# Patient Record
Sex: Female | Born: 1970
Health system: Southern US, Community
[De-identification: ages and names within clinical notes are randomized; demographics above are authoritative.]

## PROBLEM LIST (undated history)

## (undated) DIAGNOSIS — T7840XA Allergy, unspecified, initial encounter: Secondary | ICD-10-CM

## (undated) DIAGNOSIS — I10 Essential (primary) hypertension: Secondary | ICD-10-CM

## (undated) DIAGNOSIS — Z8249 Family history of ischemic heart disease and other diseases of the circulatory system: Secondary | ICD-10-CM

## (undated) HISTORY — DX: Essential (primary) hypertension: I10

## (undated) HISTORY — DX: Allergy, unspecified, initial encounter: T78.40XA

## (undated) HISTORY — PX: DILATION AND CURETTAGE OF UTERUS: SHX78

## (undated) HISTORY — DX: Family history of ischemic heart disease and other diseases of the circulatory system: Z82.49

---

## 2017-07-24 ENCOUNTER — Other Ambulatory Visit (HOSPITAL_COMMUNITY)
Admission: RE | Admit: 2017-07-24 | Discharge: 2017-07-24 | Disposition: A | Discharge status: Home / Self Care | Payer: BLUE CROSS/BLUE SHIELD | Source: Ambulatory Visit | Attending: Obstetrics and Gynecology | Admitting: Obstetrics and Gynecology

## 2017-07-24 ENCOUNTER — Encounter: Payer: Self-pay | Admitting: Obstetrics and Gynecology

## 2017-07-24 ENCOUNTER — Ambulatory Visit (INDEPENDENT_AMBULATORY_CARE_PROVIDER_SITE_OTHER): Payer: BLUE CROSS/BLUE SHIELD | Admitting: Obstetrics and Gynecology

## 2017-07-24 ENCOUNTER — Other Ambulatory Visit: Payer: Self-pay

## 2017-07-24 VITALS — BP 122/70 | HR 60 | Resp 14 | Ht 64.0 in | Wt 150.0 lb

## 2017-07-24 DIAGNOSIS — Z1211 Encounter for screening for malignant neoplasm of colon: Secondary | ICD-10-CM | POA: Diagnosis not present

## 2017-07-24 DIAGNOSIS — R61 Generalized hyperhidrosis: Secondary | ICD-10-CM | POA: Diagnosis not present

## 2017-07-24 DIAGNOSIS — Z01419 Encounter for gynecological examination (general) (routine) without abnormal findings: Secondary | ICD-10-CM | POA: Diagnosis not present

## 2017-07-24 DIAGNOSIS — Z0000 Encounter for general adult medical examination without abnormal findings: Secondary | ICD-10-CM | POA: Diagnosis not present

## 2017-07-24 DIAGNOSIS — Z124 Encounter for screening for malignant neoplasm of cervix: Secondary | ICD-10-CM | POA: Insufficient documentation

## 2017-07-24 DIAGNOSIS — R5383 Other fatigue: Secondary | ICD-10-CM | POA: Diagnosis not present

## 2017-07-24 DIAGNOSIS — N898 Other specified noninflammatory disorders of vagina: Secondary | ICD-10-CM

## 2017-07-24 DIAGNOSIS — Z30431 Encounter for routine checking of intrauterine contraceptive device: Secondary | ICD-10-CM | POA: Diagnosis not present

## 2017-07-24 NOTE — Progress Notes (Signed)
47 y.o. [id number] MarriedCaucasianF here for annual exam.  Mirena IUD was placed on 07/24/16 (replaced her prior IUD). She doesn't have regular cycles, just irregular spotting.  Period Duration (Days): spotting 2-3 days  Period Pattern: (!) Irregular Menstrual Flow: Light Menstrual Control: Panty liner Dysmenorrhea: None  She c/o vaginal dryness with intercourse. Sometimes uses a lubricant, helps some. She has some night sweats, no hot flashes. Doesn't always wake up at night, but is sweaty when she wakes up. She is tired and stressed out.  She moved here from [place] in November. She came here for her job, it is very demanding (she is on call 24/7). Husband had a PE in January, he has some cardiac issues. Under lots of stress.  C/O fatigue, 5 lb weight gain.   Patient's last menstrual period was 07/12/2017.          Sexually active: Yes.    The current method of family planning is IUD.    Exercising: Yes.    cardio/ running/ elliptical/ rowing machine  Smoker:  no  Health Maintenance: Pap:  03/2016 WNL per patient History of abnormal Pap:  no MMG:  04-18-16 WNL  Colonoscopy:  Never BMD:   Never TDaP:  Unsure  Gardasil: N/A   reports that she has never smoked. She has never used smokeless tobacco. She reports that she drinks about 0.6 oz of alcohol per week. She reports that she does not use drugs. She works as a Solicitor. Kids are 23, 15 and 12. Husband has 4 kids, his 62 and 70 year old with them.   History reviewed. No pertinent past medical history.  Past Surgical History:  Procedure Laterality Date  . DILATION AND CURETTAGE OF UTERUS     1998 & 2001     Current Outpatient Medications  Medication Sig Dispense Refill  . cetirizine (ZYRTEC) 10 MG tablet Take 10 mg by mouth daily.    Marland Kitchen levonorgestrel (MIRENA) 20 MCG/24HR IUD 1 each by Intrauterine route once.     No current facility-administered medications for this visit.     Family History  Problem Relation Age of Onset  .  Emphysema Mother   . Coronary artery disease Father   . Coronary artery disease Maternal Grandfather   . Cervical cancer Paternal Grandmother     Review of Systems  Constitutional: Negative.   HENT: Negative.   Eyes: Negative.   Respiratory: Negative.   Cardiovascular: Negative.   Gastrointestinal: Negative.   Endocrine: Negative.   Genitourinary:       Vaginal dryness   Musculoskeletal: Negative.   Skin: Negative.   Allergic/Immunologic: Negative.   Neurological: Negative.   Psychiatric/Behavioral: Negative.     Exam:   BP 122/70 (BP Location: Right Arm, Patient Position: Sitting, Cuff Size: Normal)   Pulse 60   Resp 14   Ht 5\' 4"  (1.626 m)   Wt 150 lb (68 kg)   LMP 07/12/2017   BMI 25.75 kg/m   Weight change: @WEIGHTCHANGE @ Height:   Height: 5\' 4"  (162.6 cm)  Ht Readings from Last 3 Encounters:  07/24/17 5\' 4"  (1.626 m)    General appearance: alert, cooperative and appears stated age Head: Normocephalic, without obvious abnormality, atraumatic Neck: no adenopathy, supple, symmetrical, trachea midline and thyroid normal to inspection and palpation Lungs: clear to auscultation bilaterally Cardiovascular: regular rate and rhythm Breasts: normal appearance, no masses or tenderness Abdomen: soft, non-tender; non distended,  no masses,  no organomegaly Extremities: extremities normal, atraumatic, no cyanosis or  edema Skin: Skin color, texture, turgor normal. No rashes or lesions Lymph nodes: Cervical, supraclavicular, and axillary nodes normal. No abnormal inguinal nodes palpated Neurologic: Grossly normal   Pelvic: External genitalia:  no lesions              Urethra:  normal appearing urethra with no masses, tenderness or lesions              Bartholins and Skenes: normal                 Vagina: normal appearing vagina with normal color and discharge, no lesions. Small grade 2 rectocele, not symptomatic              Cervix: no lesions, IUD string 3 cm                Bimanual Exam:  Uterus:  normal size, contour, position, consistency, mobility, non-tender              Adnexa: no mass, fullness, tenderness               Rectovaginal: Confirms               Anus:  normal sphincter tone, no lesions  Chaperone was present for exam.  A:  Well Woman with normal exam  IUD check  Night sweats  Vaginal dryness  Rectocele, not symptomatic. Try to avoid heavy lifting and straining   P:   Pap with hpv  Screening labs, FSH, TSH  Will start vaginal estrogen  Discussed behavioral changes and herbal products for menopause  Discussed breast self exam  Discussed calcium and vit D intake

## 2017-07-24 NOTE — Patient Instructions (Addendum)
EXERCISE AND DIET:  We recommended that you start or continue a regular exercise program for good health. Regular exercise means any activity that makes your heart beat faster and makes you sweat.  We recommend exercising at least 30 minutes per day at least 3 days a week, preferably 4 or 5.  We also recommend a diet low in fat and sugar.  Inactivity, poor dietary choices and obesity can cause diabetes, heart attack, stroke, and kidney damage, among others.    ALCOHOL AND SMOKING:  Women should limit their alcohol intake to no more than 7 drinks/beers/glasses of wine (combined, not each!) per week. Moderation of alcohol intake to this level decreases your risk of breast cancer and liver damage. And of course, no recreational drugs are part of a healthy lifestyle.  And absolutely no smoking or even second hand smoke. Most people know smoking can cause heart and lung diseases, but did you know it also contributes to weakening of your bones? Aging of your skin?  Yellowing of your teeth and nails?  CALCIUM AND VITAMIN D:  Adequate intake of calcium and Vitamin D are recommended.  The recommendations for exact amounts of these supplements seem to change often, but generally speaking 600 mg of calcium (either carbonate or citrate) and 800 units of Vitamin D per day seems prudent. Certain women may benefit from higher intake of Vitamin D.  If you are among these women, your doctor will have told you during your visit.    PAP SMEARS:  Pap smears, to check for cervical cancer or precancers,  have traditionally been done yearly, although recent scientific advances have shown that most women can have pap smears less often.  However, every woman still should have a physical exam from her gynecologist every year. It will include a breast check, inspection of the vulva and vagina to check for abnormal growths or skin changes, a visual exam of the cervix, and then an exam to evaluate the size and shape of the uterus and  ovaries.  And after 48 years of age, a rectal exam is indicated to check for rectal cancers. We will also provide age appropriate advice regarding health maintenance, like when you should have certain vaccines, screening for sexually transmitted diseases, bone density testing, colonoscopy, mammograms, etc.   MAMMOGRAMS:  All women over 29 years old should have a yearly mammogram. Many facilities now offer a "3D" mammogram, which may cost around $50 extra out of pocket. If possible,  we recommend you accept the option to have the 3D mammogram performed.  It both reduces the number of women who will be called back for extra views which then turn out to be normal, and it is better than the routine mammogram at detecting truly abnormal areas.    COLONOSCOPY:  Colonoscopy to screen for colon cancer is recommended for all women at age 13.  We know, you hate the About Rectocele  Overview  A rectocele is a type of hernia which causes different degrees of bulging of the rectal tissues into the vaginal wall.  You may even notice that it presses against the vaginal wall so much that some vaginal tissues droop outside of the opening of your vagina.  Causes of Rectocele  The most common cause is childbirth.  The muscles and ligaments in the pelvis that hold up and support the female organs and vagina become stretched and weakened during labor and delivery.  The more babies you have, the more the support tissues are  stretched and weakened.  Not everyone who has a baby will develop a rectocele.  Some women have stronger supporting tissue in the pelvis and may not have as much of a problem as others.  Women who have a Cesarean section usually do not get rectocele's unless they pushed a long time prior to the cesarean delivery.  Other conditions that can cause a rectocele include chronic constipation, a chronic cough, a lot of heavy lifting, and obesity.  Older women may have this problem because the loss of female  hormones causes the vaginal tissue to become weaker.  Symptoms  There may not be any symptoms.  If you do have symptoms, they may include:  Pelvic pressure in the rectal area  Protrusion of the lower part of the vagina through the opening of the vagina  Constipation and trapping of the stool, making it difficult to have a bowel movement.  In severe cases, you may have to press on the lower part of your vagina to help push the stool out of you rectum.  This is called splinting to empty.  Diagnosing Rectocele  Your health care provider will ask about your symptoms and perform a pelvic exam.  S/he will ask you to bear down, pushing like you are having a bowel movement so as to see how far the lower part of the vagina protrudes into the vagina and possible outside of the vagina.  Your provider will also ask you to contract the muscles of your pelvis (like you are stopping the stream in the middle of urinating) to determine the strength of your pelvic muscles.  Your provider may also do a rectal exam.  Treatment Options  If you do not have any symptoms, no treatment may be necessary.  Other treatment options include:  Pelvic floor exercises: Contracting the muscles in your genital area may help strengthen your muscles and support the organs.  Be sure to get proper exercise instruction from you physical therapist.  A pessary (removealbe pelvic support device) sometimes helps rectocele symptoms.  Surgery: Surgical repair may be necessary. In some cases the uterus may need to be taken out ( a hysterectomy) as well.  There are many types of surgery for pelvic support problems.  Look for physicians who specialize in repair procedures.  You can take care of yourself by:  Treating and preventing constipation  Avoiding heavy lifting, and lifting correctly (with your legs, not with you waist or back)  Treating a chronic cough or bronchitis  Not smoking  avoiding too much weight gain  Doing  pelvic floor exercises   2007, [employer] Doc.[email or web address] of the prep.  We agree, BUT, having colon cancer and not knowing it is worse!!  Colon cancer so often starts as a polyp that can be seen and removed at colonscopy, which can quite literally save your life!  And if your first colonoscopy is normal and you have no family history of colon cancer, most women don't have to have it again for 10 years.  Once every ten years, you can do something that may end up saving your life, right?  We will be happy to help you get it scheduled when you are ready.  Be sure to check your insurance coverage so you understand how much it will cost.  It may be covered as a preventative service at no cost, but you should check your particular policy.

## 2017-07-25 LAB — COMPREHENSIVE METABOLIC PANEL
ALT: 18 IU/L (ref 0–32)
AST: 18 IU/L (ref 0–40)
Albumin/Globulin Ratio: 1.7 (ref 1.2–2.2)
Albumin: 4.3 g/dL (ref 3.5–5.5)
Alkaline Phosphatase: 53 IU/L (ref 39–117)
BILIRUBIN TOTAL: 0.3 mg/dL (ref 0.0–1.2)
BUN/Creatinine Ratio: 15 (ref 9–23)
BUN: 13 mg/dL (ref 6–24)
CALCIUM: 9.8 mg/dL (ref 8.7–10.2)
CHLORIDE: 105 mmol/L (ref 96–106)
CO2: 25 mmol/L (ref 20–29)
Creatinine, Ser: 0.87 mg/dL (ref 0.57–1.00)
GFR calc Af Amer: 92 mL/min/{1.73_m2} (ref >59)
GFR calc non Af Amer: 80 mL/min/{1.73_m2} (ref >59)
Globulin, Total: 2.6 g/dL (ref 1.5–4.5)
Glucose: 69 mg/dL (ref 65–99)
Potassium: 4.6 mmol/L (ref 3.5–5.2)
Sodium: 146 mmol/L — ABNORMAL HIGH (ref 134–144)
Total Protein: 6.9 g/dL (ref 6.0–8.5)

## 2017-07-25 LAB — FOLLICLE STIMULATING HORMONE: FSH: 10.3 m[IU]/mL

## 2017-07-25 LAB — CBC
Hematocrit: 44.8 % (ref 34.0–46.6)
Hemoglobin: 14.5 g/dL (ref 11.1–15.9)
MCH: 28.8 pg (ref 26.6–33.0)
MCHC: 32.4 g/dL (ref 31.5–35.7)
MCV: 89 fL (ref 79–97)
PLATELETS: 241 10*3/uL (ref 150–379)
RBC: 5.03 x10E6/uL (ref 3.77–5.28)
RDW: 13.4 % (ref 12.3–15.4)
WBC: 7.7 10*3/uL (ref 3.4–10.8)

## 2017-07-25 LAB — LIPID PANEL
CHOLESTEROL TOTAL: 160 mg/dL (ref 100–199)
Chol/HDL Ratio: 4 ratio (ref 0.0–4.4)
HDL: 40 mg/dL (ref >39)
LDL Calculated: 94 mg/dL (ref 0–99)
TRIGLYCERIDES: 128 mg/dL (ref 0–149)
VLDL Cholesterol Cal: 26 mg/dL (ref 5–40)

## 2017-07-25 LAB — TSH: TSH: 1.87 u[IU]/mL (ref 0.450–4.500)

## 2017-07-28 LAB — CYTOLOGY - PAP
DIAGNOSIS: NEGATIVE
HPV: NOT DETECTED

## 2017-07-31 ENCOUNTER — Other Ambulatory Visit: Payer: Self-pay | Admitting: Obstetrics and Gynecology

## 2017-07-31 DIAGNOSIS — Z1231 Encounter for screening mammogram for malignant neoplasm of breast: Secondary | ICD-10-CM

## 2017-08-05 ENCOUNTER — Encounter: Payer: Self-pay | Admitting: Family Medicine

## 2017-08-05 ENCOUNTER — Ambulatory Visit (INDEPENDENT_AMBULATORY_CARE_PROVIDER_SITE_OTHER): Payer: BLUE CROSS/BLUE SHIELD | Admitting: Family Medicine

## 2017-08-05 ENCOUNTER — Other Ambulatory Visit: Payer: Self-pay

## 2017-08-05 VITALS — BP 122/60 | HR 82 | Temp 98.1°F | Resp 14 | Ht 64.0 in | Wt 154.0 lb

## 2017-08-05 DIAGNOSIS — G4700 Insomnia, unspecified: Secondary | ICD-10-CM

## 2017-08-05 DIAGNOSIS — Z0000 Encounter for general adult medical examination without abnormal findings: Secondary | ICD-10-CM

## 2017-08-05 NOTE — Progress Notes (Signed)
   Subjective:    Patient ID: Sheri Madden, female    DOB: [birth year], 47 y.o.   MRN: [id number]  Patient presents for [place] CPE (is fasting)   Pt here to establish care and for CPE Previous PCP  IN [place] - Dr. Gery Pray  GYN- Dr. Gertie Exon  Retired from [employer] 2015 Immunizations UTD  Mammogram- scheduled for May 24 PAP Smear UTD  Fasting labs done at GYN-  Lipids normal, CBC/CMET normal   Does not sleep well, has some stressors only provider in home, does not want prescription meds  Review Of Systems:  GEN- denies fatigue, fever, weight loss,weakness, recent illness HEENT- denies eye drainage, change in vision, nasal discharge, CVS- denies chest pain, palpitations RESP- denies SOB, cough, wheeze ABD- denies N/V, change in stools, abd pain GU- denies dysuria, hematuria, dribbling, incontinence MSK- denies joint pain, muscle aches, injury Neuro- denies headache, dizziness, syncope, seizure activity       Objective:    BP 122/60   Pulse 82   Temp 98.1 F (36.7 C) (Oral)   Resp 14   Ht  (1.626 m)   Wt 154 lb (69.9 kg)   LMP 07/12/2017   SpO2 98%   BMI 26.43 kg/m  GEN- NAD, alert and oriented x3 HEENT- PERRL, EOMI, non injected sclera, pink conjunctiva, MMM, oropharynx clear Neck- Supple, no thyromegaly CVS- RRR, no murmur RESP-CTAB ABD-NABS,soft,NT,ND Psych- normal affect and mood EXT- No edema Pulses- Radial, DP- 2+        Assessment & Plan:      Problem List Items Addressed This Visit    None    Visit Diagnoses    Routine general medical examination at a health care facility    -  Primary   CPE done,fastinglabs, GYN UTD   Insomnia, unspecified type       Trial of melatonin, chamomille/lavender herbs teas      Note: This dictation was prepared with Dragon dictation along with smaller phrase technology. Any transcriptional errors that result from this process are unintentional.

## 2017-08-05 NOTE — Patient Instructions (Addendum)
Try melatonin  Chamomille/Lavender  F/U as needed

## 2017-08-06 ENCOUNTER — Encounter: Payer: Self-pay | Admitting: Family Medicine

## 2017-08-14 LAB — FECAL OCCULT BLOOD, IMMUNOCHEMICAL: IFOBT: NEGATIVE

## 2017-08-22 ENCOUNTER — Ambulatory Visit
Admission: RE | Admit: 2017-08-22 | Discharge: 2017-08-22 | Disposition: A | Discharge status: Home / Self Care | Payer: BLUE CROSS/BLUE SHIELD | Source: Ambulatory Visit | Attending: Obstetrics and Gynecology | Admitting: Obstetrics and Gynecology

## 2017-08-22 DIAGNOSIS — Z1231 Encounter for screening mammogram for malignant neoplasm of breast: Secondary | ICD-10-CM

## 2017-12-21 DIAGNOSIS — S8264XA Nondisplaced fracture of lateral malleolus of right fibula, initial encounter for closed fracture: Secondary | ICD-10-CM | POA: Diagnosis not present

## 2017-12-21 DIAGNOSIS — M25571 Pain in right ankle and joints of right foot: Secondary | ICD-10-CM | POA: Diagnosis not present

## 2017-12-21 DIAGNOSIS — S81021A Laceration with foreign body, right knee, initial encounter: Secondary | ICD-10-CM | POA: Diagnosis not present

## 2017-12-30 DIAGNOSIS — S93401D Sprain of unspecified ligament of right ankle, subsequent encounter: Secondary | ICD-10-CM | POA: Diagnosis not present

## 2017-12-30 DIAGNOSIS — Z4802 Encounter for removal of sutures: Secondary | ICD-10-CM | POA: Diagnosis not present

## 2018-02-19 DIAGNOSIS — H40013 Open angle with borderline findings, low risk, bilateral: Secondary | ICD-10-CM | POA: Diagnosis not present

## 2018-03-23 DIAGNOSIS — H40013 Open angle with borderline findings, low risk, bilateral: Secondary | ICD-10-CM | POA: Diagnosis not present

## 2018-06-08 DIAGNOSIS — H40013 Open angle with borderline findings, low risk, bilateral: Secondary | ICD-10-CM | POA: Diagnosis not present

## 2018-08-11 ENCOUNTER — Encounter: Payer: Self-pay | Admitting: Family Medicine

## 2018-08-11 ENCOUNTER — Other Ambulatory Visit: Payer: Self-pay

## 2018-08-11 ENCOUNTER — Ambulatory Visit (INDEPENDENT_AMBULATORY_CARE_PROVIDER_SITE_OTHER): Payer: BLUE CROSS/BLUE SHIELD | Admitting: Family Medicine

## 2018-08-11 VITALS — BP 124/66 | HR 58 | Temp 98.1°F | Resp 14 | Ht 64.0 in | Wt 151.0 lb

## 2018-08-11 DIAGNOSIS — F5104 Psychophysiologic insomnia: Secondary | ICD-10-CM

## 2018-08-11 DIAGNOSIS — Z124 Encounter for screening for malignant neoplasm of cervix: Secondary | ICD-10-CM | POA: Diagnosis not present

## 2018-08-11 DIAGNOSIS — R6889 Other general symptoms and signs: Secondary | ICD-10-CM | POA: Diagnosis not present

## 2018-08-11 DIAGNOSIS — R87618 Other abnormal cytological findings on specimens from cervix uteri: Secondary | ICD-10-CM | POA: Diagnosis not present

## 2018-08-11 DIAGNOSIS — Z0000 Encounter for general adult medical examination without abnormal findings: Secondary | ICD-10-CM

## 2018-08-11 DIAGNOSIS — Z1239 Encounter for other screening for malignant neoplasm of breast: Secondary | ICD-10-CM

## 2018-08-11 DIAGNOSIS — Z1321 Encounter for screening for nutritional disorder: Secondary | ICD-10-CM | POA: Diagnosis not present

## 2018-08-11 DIAGNOSIS — Z0001 Encounter for general adult medical examination with abnormal findings: Secondary | ICD-10-CM | POA: Diagnosis not present

## 2018-08-11 NOTE — Patient Instructions (Signed)
F/u 1 year for physical  I recommend eye visit once a year I recommend dental visit every 6 months Goal is to  Exercise 30 minutes 5 days a week We will send a letter with lab results

## 2018-08-11 NOTE — Progress Notes (Signed)
Subjective:    Patient ID: Sheri Madden, female    DOB: 1970/10/16, 48 y.o.   MRN: [id number]  Patient presents for Gynecologic Exam (is fasting- needs mammo orders)  Here for complete physical exam and Pap smear.  Oct 2019 had TDAP after fall, she fell walking the dog she had stitches and was also in a walking boot for probable ankle sprain that is now resolved.   Due for fasting labs  Due for mammogram this month. Immunizations are up-to-date  No change in her history.  He does note that her blood pressures been elevated on occasion.  The diastolic was in the 90s.  She has not had any chest pain or shortness of breath.  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  He is exercising she runs 3 miles or does the elliptical at least 2-3 times a week and on the weekends she runs 5 to 6 miles which helps with stress reduce.  She has been trying to read more before bedtime to also help with stress levels.  No menstrual cycle she has an IUD  Follows with dentist and eye doctor (contacts) Dr. Annice P[facility]   Review Of Systems:  GEN- denies fatigue, fever, weight loss,weakness, recent illness HEENT- denies eye drainage, change in vision, nasal discharge, CVS- denies chest pain, palpitations RESP- denies SOB, cough, wheeze ABD- denies N/V, change in stools, abd pain GU- denies dysuria, hematuria, dribbling, incontinence MSK- denies joint pain, muscle aches, injury Neuro- denies headache, dizziness, syncope, seizure activity       Objective:    BP 124/66   Pulse (!) 58   Temp 98.1 F (36.7 C) (Oral)   Resp 14   Ht 5\' 4"  (1.626 m)   Wt 151 lb (68.5 kg)   SpO2 97%   BMI 25.92 kg/m  GEN- NAD, alert and oriented x3 HEENT- PERRL, EOMI, non injected sclera, pink conjunctiva, MMM, oropharynx clear, TM clear no  effusion  Breast- normal symmetry, no nipple inversion,no nipple drainage, no nodules or lumps felt Nodes- no axillary nodes Neck- Supple, no thyromegaly CVS- RRR, no murmur RESP-CTAB GU- normal external genitalia, vaginal mucosa pink and moist, cervix visualized no growth, no blood form os, IUD present, no CMT, no ovarian masses, uterus normal size ABD-NABS,soft,NT,ND Psych- normal affect and mood EXT- No edema Pulses- Radial, DP- 2+        Assessment & Plan:      Problem List Items Addressed This Visit      Unprioritized   Chronic insomnia    Gust medication however in the setting of her job needs to be available 24 hours I cannot guarantee that she may not fall into a deep sleep or be very somnolent if she is woken up in the middle the night and not be able to do her job properly so we will hold off on prescription medications.  Continue with natural remedies such as the exercise sleep hygiene at bedtime avoiding her telephone television trying to read a book before sleep.       Other Visit Diagnoses    Routine general medical examination at a health care facility    -  Primary   CPE done, BP normal, will have her monitor at home, call if > 140/90 consistently. Fasting labs, PAP done,  Mammo to be done   Relevant Orders   CBC with Differential/Platelet   Comprehensive metabolic panel   Lipid panel   Cervical cancer screening       Relevant Orders   Pap IG w/ reflex to HPV when ASC-U   Breast cancer screening       Relevant Orders   MM 3D SCREEN BREAST BILATERAL   Encounter for vitamin deficiency screening       Relevant Orders   Vitamin D, 25-hydroxy      Note: This dictation was prepared with Dragon dictation along with smaller phrase technology. Any transcriptional errors that result from this process are unintentional.

## 2018-08-11 NOTE — Assessment & Plan Note (Signed)
Gust medication however in the setting of her job needs to be available 24 hours I cannot guarantee that she may not fall into a deep sleep or be very somnolent if she is woken up in the middle the night and not be able to do her job properly so we will hold off on prescription medications.  Continue with natural remedies such as the exercise sleep hygiene at bedtime avoiding her telephone television trying to read a book before sleep.

## 2018-08-12 ENCOUNTER — Ambulatory Visit: Payer: BLUE CROSS/BLUE SHIELD | Admitting: Obstetrics and Gynecology

## 2018-08-12 LAB — CBC WITH DIFFERENTIAL/PLATELET
Absolute Monocytes: 302 cells/uL (ref 200–950)
Basophils Absolute: 49 cells/uL (ref 0–200)
Basophils Relative: 0.9 %
Eosinophils Absolute: 59 cells/uL (ref 15–500)
Eosinophils Relative: 1.1 %
HCT: 43.9 % (ref 35.0–45.0)
Hemoglobin: 14.9 g/dL (ref 11.7–15.5)
Lymphs Abs: 2176 cells/uL (ref 850–3900)
MCH: 29.9 pg (ref 27.0–33.0)
MCHC: 33.9 g/dL (ref 32.0–36.0)
MCV: 88 fL (ref 80.0–100.0)
MPV: 12.7 fL — ABNORMAL HIGH (ref 7.5–12.5)
Monocytes Relative: 5.6 %
Neutro Abs: 2813 cells/uL (ref 1500–7800)
Neutrophils Relative %: 52.1 %
Platelets: 237 10*3/uL (ref 140–400)
RBC: 4.99 10*6/uL (ref 3.80–5.10)
RDW: 12.9 % (ref 11.0–15.0)
Total Lymphocyte: 40.3 %
WBC: 5.4 10*3/uL (ref 3.8–10.8)

## 2018-08-12 LAB — LIPID PANEL
Cholesterol: 196 mg/dL (ref <200)
HDL: 44 mg/dL — ABNORMAL LOW (ref >=50)
LDL Cholesterol (Calc): 130 mg/dL (calc) — ABNORMAL HIGH
Non-HDL Cholesterol (Calc): 152 mg/dL (calc) — ABNORMAL HIGH (ref <130)
Total CHOL/HDL Ratio: 4.5 (calc) (ref <5.0)
Triglycerides: 117 mg/dL (ref <150)

## 2018-08-12 LAB — COMPREHENSIVE METABOLIC PANEL
AG Ratio: 2 (calc) (ref 1.0–2.5)
ALT: 21 U/L (ref 6–29)
AST: 21 U/L (ref 10–35)
Albumin: 4.7 g/dL (ref 3.6–5.1)
Alkaline phosphatase (APISO): 48 U/L (ref 31–125)
BUN: 14 mg/dL (ref 7–25)
CO2: 25 mmol/L (ref 20–32)
Calcium: 9.4 mg/dL (ref 8.6–10.2)
Chloride: 104 mmol/L (ref 98–110)
Creat: 0.79 mg/dL (ref 0.50–1.10)
Globulin: 2.3 g/dL (calc) (ref 1.9–3.7)
Glucose, Bld: 77 mg/dL (ref 65–99)
Potassium: 4 mmol/L (ref 3.5–5.3)
Sodium: 140 mmol/L (ref 135–146)
Total Bilirubin: 0.6 mg/dL (ref 0.2–1.2)
Total Protein: 7 g/dL (ref 6.1–8.1)

## 2018-08-12 LAB — PAP IG W/ RFLX HPV ASCU

## 2018-08-12 LAB — VITAMIN D 25 HYDROXY (VIT D DEFICIENCY, FRACTURES): Vit D, 25-Hydroxy: 27 ng/mL — ABNORMAL LOW (ref 30–100)

## 2018-08-14 ENCOUNTER — Encounter: Payer: Self-pay | Admitting: *Deleted

## 2018-08-14 DIAGNOSIS — H40013 Open angle with borderline findings, low risk, bilateral: Secondary | ICD-10-CM | POA: Diagnosis not present

## 2018-08-26 ENCOUNTER — Ambulatory Visit
Admission: RE | Admit: 2018-08-26 | Discharge: 2018-08-26 | Disposition: A | Discharge status: Home / Self Care | Payer: BLUE CROSS/BLUE SHIELD | Source: Ambulatory Visit | Attending: Family Medicine | Admitting: Family Medicine

## 2018-08-26 ENCOUNTER — Other Ambulatory Visit: Payer: Self-pay

## 2018-08-26 DIAGNOSIS — Z1239 Encounter for other screening for malignant neoplasm of breast: Secondary | ICD-10-CM

## 2018-08-26 DIAGNOSIS — Z1231 Encounter for screening mammogram for malignant neoplasm of breast: Secondary | ICD-10-CM | POA: Diagnosis not present

## 2019-02-19 DIAGNOSIS — H401111 Primary open-angle glaucoma, right eye, mild stage: Secondary | ICD-10-CM | POA: Diagnosis not present

## 2019-02-19 DIAGNOSIS — H40022 Open angle with borderline findings, high risk, left eye: Secondary | ICD-10-CM | POA: Diagnosis not present

## 2019-03-30 DIAGNOSIS — H40022 Open angle with borderline findings, high risk, left eye: Secondary | ICD-10-CM | POA: Diagnosis not present

## 2019-03-30 DIAGNOSIS — H401111 Primary open-angle glaucoma, right eye, mild stage: Secondary | ICD-10-CM | POA: Diagnosis not present

## 2019-06-01 DIAGNOSIS — H40022 Open angle with borderline findings, high risk, left eye: Secondary | ICD-10-CM | POA: Diagnosis not present

## 2019-06-01 DIAGNOSIS — H04123 Dry eye syndrome of bilateral lacrimal glands: Secondary | ICD-10-CM | POA: Diagnosis not present

## 2019-06-01 DIAGNOSIS — H401111 Primary open-angle glaucoma, right eye, mild stage: Secondary | ICD-10-CM | POA: Diagnosis not present

## 2019-08-13 ENCOUNTER — Ambulatory Visit: Payer: BC Managed Care – PPO | Admitting: Family Medicine

## 2019-08-23 ENCOUNTER — Ambulatory Visit: Payer: BLUE CROSS/BLUE SHIELD | Admitting: Obstetrics and Gynecology

## 2019-08-31 DIAGNOSIS — H401111 Primary open-angle glaucoma, right eye, mild stage: Secondary | ICD-10-CM | POA: Diagnosis not present

## 2019-08-31 DIAGNOSIS — H40022 Open angle with borderline findings, high risk, left eye: Secondary | ICD-10-CM | POA: Diagnosis not present

## 2019-12-20 ENCOUNTER — Ambulatory Visit: Payer: BC Managed Care – PPO | Admitting: Family Medicine

## 2019-12-20 ENCOUNTER — Other Ambulatory Visit: Payer: Self-pay

## 2019-12-20 ENCOUNTER — Encounter: Payer: Self-pay | Admitting: Family Medicine

## 2019-12-20 VITALS — BP 130/74 | HR 62 | Temp 98.3°F | Resp 14 | Ht 64.0 in | Wt 160.0 lb

## 2019-12-20 DIAGNOSIS — N816 Rectocele: Secondary | ICD-10-CM | POA: Diagnosis not present

## 2019-12-20 DIAGNOSIS — Z23 Encounter for immunization: Secondary | ICD-10-CM

## 2019-12-20 DIAGNOSIS — R102 Pelvic and perineal pain: Secondary | ICD-10-CM | POA: Diagnosis not present

## 2019-12-20 DIAGNOSIS — Z124 Encounter for screening for malignant neoplasm of cervix: Secondary | ICD-10-CM | POA: Diagnosis not present

## 2019-12-20 DIAGNOSIS — R87610 Atypical squamous cells of undetermined significance on cytologic smear of cervix (ASC-US): Secondary | ICD-10-CM | POA: Diagnosis not present

## 2019-12-20 LAB — WET PREP FOR TRICH, YEAST, CLUE

## 2019-12-20 NOTE — Patient Instructions (Addendum)
Referral to GYN We will call with results  F/U as needed

## 2019-12-20 NOTE — Progress Notes (Signed)
    Subjective:    Patient ID: Sheri Madden, female    DOB: March 10, 1971, 49 y.o.   MRN: [id number]  Patient presents for Pelvic Pain (x2 weeks- pain in lower pelvic area, cramping, hasMirena IUD- can feel strings- no pain with sex, urination, BM)  Pt here with pelvic pressure, cramping, started 2 weeks ago She has IUD with some sotting Typically gets some spotting each month with menses  She feels pressure like something is coming out  , often feelsl like she needs to have BM and nothing happens  When she looked with the mirror didn't see anything in particular PAP Smear last yer, WNL  SawGYN a few years ago   Review Of Systems:  GEN- denies fatigue, fever, weight loss,weakness, recent illness HEENT- denies eye drainage, change in vision, nasal discharge, CVS- denies chest pain, palpitations RESP- denies SOB, cough, wheeze ABD- denies N/V, change in stools, abd pain GU- denies dysuria, hematuria, dribbling, incontinence MSK- denies joint pain, muscle aches, injury Neuro- denies headache, dizziness, syncope, seizure activity       Objective:    BP 130/74   Pulse 62   Temp 98.3 F (36.8 C) (Temporal)   Resp 14   Ht 5\' 4"  (1.626 m)   Wt 160 lb (72.6 kg)   SpO2 96%   BMI 27.46 kg/m  GEN- NAD, alert and oriented x3 CVS- RRR, no murmur RESP-CTAB ABD-NABS,soft,NT,ND GU- normal external genitalia, vaginal mucosa pink and moist, cervix visualized no growthbut low hanging in vault, no blood form os, minimal thin clear discharge, no CMT, no ovarian masses, uterus normal size, rectocele palpated, IUD strings visible  Rectum- external skin tag, normal tone, FOBT neg, EXT- No edema Pulses- Radial 2+        Assessment & Plan:      Problem List Items Addressed This Visit    None    Visit Diagnoses    Cervical cancer screening    -  Primary   Relevant Orders   Pap IG w/ reflex to HPV when ASC-U   Pelvic pain       Pelvic pressure secondary rectocele though may have  mild uterine prolapse, IUD in place referral back to GYN, PAP obtained    Relevant Orders   WET PREP FOR TRICH, YEAST, CLUE (Completed)   Ambulatory referral to Gynecology   Rectocele       Relevant Orders   Ambulatory referral to Gynecology      Note: This dictation was prepared with Dragon dictation along with smaller phrase technology. Any transcriptional errors that result from this process are unintentional.

## 2019-12-21 ENCOUNTER — Other Ambulatory Visit: Payer: Self-pay | Admitting: Obstetrics and Gynecology

## 2019-12-21 ENCOUNTER — Telehealth: Payer: Self-pay

## 2019-12-21 DIAGNOSIS — Z1231 Encounter for screening mammogram for malignant neoplasm of breast: Secondary | ICD-10-CM

## 2019-12-21 NOTE — Telephone Encounter (Signed)
Spoke with patient. Patient was seen by PCP on 12/21/19 for wellness exam and PAP, was advised to f/u with GYN for evaluation of possible rectocele, uterine prolapse.  PAP pending. Last AEX 07/14/17.   Patient reports she was running on the treadmill 2 wks ago and felt pressure in her rectum like she needed to have a BM, but did not need to. Feels like "things are falling out".  Reports no difficulty with BMs or voiding. Has been experiencing frequent urination. Requesting to schedule OV. OV scheduled for 12/21/19 at 8am with Dr. Oscar La.   Patient is agreeable to date and time.   Routing to [employer].    Encounter closed.

## 2019-12-21 NOTE — Telephone Encounter (Signed)
Patient is calling in regards pap results done at PCP. Patient states she is having some issues.

## 2019-12-22 ENCOUNTER — Encounter: Payer: Self-pay | Admitting: Obstetrics and Gynecology

## 2019-12-22 ENCOUNTER — Other Ambulatory Visit: Payer: Self-pay

## 2019-12-22 ENCOUNTER — Ambulatory Visit: Payer: BC Managed Care – PPO | Admitting: Obstetrics and Gynecology

## 2019-12-22 VITALS — BP 116/64 | HR 67 | Wt 156.8 lb

## 2019-12-22 DIAGNOSIS — N8111 Cystocele, midline: Secondary | ICD-10-CM

## 2019-12-22 DIAGNOSIS — N898 Other specified noninflammatory disorders of vagina: Secondary | ICD-10-CM | POA: Diagnosis not present

## 2019-12-22 DIAGNOSIS — N816 Rectocele: Secondary | ICD-10-CM | POA: Diagnosis not present

## 2019-12-22 NOTE — Patient Instructions (Addendum)
Kegel Exercises  Kegel exercises can help strengthen your pelvic floor muscles. The pelvic floor is a group of muscles that support your rectum, small intestine, and bladder. In females, pelvic floor muscles also help support the womb (uterus). These muscles help you control the flow of urine and stool. Kegel exercises are painless and simple, and they do not require any equipment. Your provider may suggest Kegel exercises to:  Improve bladder and bowel control.  Improve sexual response.  Improve weak pelvic floor muscles after surgery to remove the uterus (hysterectomy) or pregnancy (females).  Improve weak pelvic floor muscles after prostate gland removal or surgery (males). Kegel exercises involve squeezing your pelvic floor muscles, which are the same muscles you squeeze when you try to stop the flow of urine or keep from passing gas. The exercises can be done while sitting, standing, or lying down, but it is best to vary your position. Exercises How to do Kegel exercises: 1. Squeeze your pelvic floor muscles tight. You should feel a tight lift in your rectal area. If you are a female, you should also feel a tightness in your vaginal area. Keep your stomach, buttocks, and legs relaxed. 2. Hold the muscles tight for up to 10 seconds. 3. Breathe normally. 4. Relax your muscles. 5. Repeat as told by your health care provider. Repeat this exercise daily as told by your health care provider. Continue to do this exercise for at least 4-6 weeks, or for as long as told by your health care provider. You may be referred to a physical therapist who can help you learn more about how to do Kegel exercises. Depending on your condition, your health care provider may recommend:  Varying how long you squeeze your muscles.  Doing several sets of exercises every day.  Doing exercises for several weeks.  Making Kegel exercises a part of your regular exercise routine. This information is not intended  to replace advice given to you by your health care provider. Make sure you discuss any questions you have with your health care provider. Document Revised: 11/05/2017 Document Reviewed: 11/05/2017 Elsevier Patient Education  [place]. About Cystocele  Overview  The pelvic organs, including the bladder, are normally supported by pelvic floor muscles and ligaments.  When these muscles and ligaments are stretched, weakened or torn, the wall between the bladder and the vagina sags or herniates causing a prolapse, sometimes called a cystocele.  This condition may cause discomfort and problems with emptying the bladder.  It can be present in various stages.  Some people are not aware of the changes.  Others may notice changes at the vaginal opening or a feeling of the bladder dropping outside the body.  Causes of a Cystocele  A cystocele is usually caused by muscle straining or stretching during childbirth.  In addition, cystocele is more common after menopause, because the hormone estrogen helps keep the elastic tissues around the pelvic organs strong.  A cystocele is more likely to occur when levels of estrogen decrease.  Other causes include: heavy lifting, chronic coughing, previous pelvic surgery and obesity.  Symptoms  A bladder that has dropped from its normal position may cause: unwanted urine leakage (stress incontinence), frequent urination or urge to urinate, incomplete emptying of the bladder (not feeling bladder relief after emptying), pain or discomfort in the vagina, pelvis, groin, lower back or lower abdomen and frequent urinary tract infections.  Mild cases may not cause any symptoms.  Treatment Options  Pelvic floor (Kegel) exercises:  Strength training the muscles in your genital area  Behavioral changes: Treating and preventing constipation, taking time to empty your bladder properly, learning to lift properly and/or avoid heavy lifting when possible, stopping smoking,  avoiding weight gain and treating a chronic cough or bronchitis.  A pessary: A vaginal support device is sometimes used to help pelvic support caused by muscle and ligament changes.  Surgery: Surgical repair may be necessary if symptoms cannot be managed with exercise, behavioral changes and a pessary.  Surgery is usually considered for severe cases.   2007, [employer]About Rectocele  Overview  A rectocele is a type of hernia which causes different degrees of bulging of the rectal tissues into the vaginal wall.  You may even notice that it presses against the vaginal wall so much that some vaginal tissues droop outside of the opening of your vagina.  Causes of Rectocele  The most common cause is childbirth.  The muscles and ligaments in the pelvis that hold up and support the female organs and vagina become stretched and weakened during labor and delivery.  The more babies you have, the more the support tissues are stretched and weakened.  Not everyone who has a baby will develop a rectocele.  Some women have stronger supporting tissue in the pelvis and may not have as much of a problem as others.  Women who have a Cesarean section usually do not get rectocele's unless they pushed a long time prior to the cesarean delivery.  Other conditions that can cause a rectocele include chronic constipation, a chronic cough, a lot of heavy lifting, and obesity.  Older women may have this problem because the loss of female hormones causes the vaginal tissue to become weaker.  Symptoms  There may not be any symptoms.  If you do have symptoms, they may include:  Pelvic pressure in the rectal area  Protrusion of the lower part of the vagina through the opening of the vagina  Constipation and trapping of the stool, making it difficult to have a bowel movement.  In severe cases, you may have to press on the lower part of your vagina to help push the stool out of you rectum.  This is called  splinting to empty.  Diagnosing Rectocele  Your health care provider will ask about your symptoms and perform a pelvic exam.  S/he will ask you to bear down, pushing like you are having a bowel movement so as to see how far the lower part of the vagina protrudes into the vagina and possible outside of the vagina.  Your provider will also ask you to contract the muscles of your pelvis (like you are stopping the stream in the middle of urinating) to determine the strength of your pelvic muscles.  Your provider may also do a rectal exam.  Treatment Options  If you do not have any symptoms, no treatment may be necessary.  Other treatment options include:  Pelvic floor exercises: Contracting the muscles in your genital area may help strengthen your muscles and support the organs.  Be sure to get proper exercise instruction from you physical therapist.  A pessary (removealbe pelvic support device) sometimes helps rectocele symptoms.  Surgery: Surgical repair may be necessary. In some cases the uterus may need to be taken out ( a hysterectomy) as well.  There are many types of surgery for pelvic support problems.  Look for physicians who specialize in repair procedures.  You can take care of yourself by:  Treating and  preventing constipation  Avoiding heavy lifting, and lifting correctly (with your legs, not with you waist or back)  Treating a chronic cough or bronchitis  Not smoking  avoiding too much weight gain  Doing pelvic floor exercises   2007, [employer] Doc.33

## 2019-12-22 NOTE — Progress Notes (Signed)
GYNECOLOGY  VISIT   HPI: 49 y.o.   Married White or Caucasian Not Hispanic or Latino  female   [phone] with No LMP recorded. (Menstrual status: IUD).   here for follow up of rectocele prolapse.    She was noted to have a small grade 2 rectocele at the time of her annual exam in 4/19, at that time she wasn't symptomatic. 2 weeks ago she was running on the treadmill, felt like she had to have a BM, she didn't have to go. She had a cramping pressure in her pelvis.  She notices a bulge at the opening of her vagina if she feels with her hand or looks with a mirror. She sometimes feels something at her opening of her vagina, similar to a tampon not being in correctly. She has only noticed this in the last 2 weeks.  She has been doing a high intensity work outs for several months. She has a BM every 1-2 days, doesn't typically strain. Never has to reduce her rectocele to have a BM.  She does c/o frequent urination for the last month. Voiding smaller amounts, drinking more water. Occasionally has urgency, but can hold it. No leakage.  Sexually active, no pain. She has some dryness, lubricant helps. No baseline dryness.   Her Dad this summer at 63 of esophageal cancer. Mom died at 77 of emphysema.    She has a mirena IUD, placed in 4/18. She has monthly spotting for 2-3 days. No significant vasomotor symptoms.   GYNECOLOGIC HISTORY: No LMP recorded. (Menstrual status: IUD). Contraception:IUD Menopausal hormone therapy: none         OB History    Gravida  5   Para  3   Term  3   Preterm      AB  2   Living  3     SAB  2   TAB      Ectopic      Multiple      Live Births  3              Patient Active Problem List   Diagnosis Date Noted  . Chronic insomnia 08/11/2018    Past Medical History:  Diagnosis Date  . Allergy    seasonal    Past Surgical History:  Procedure Laterality Date  . DILATION AND CURETTAGE OF UTERUS     1998 & 2001     Current Outpatient  Medications  Medication Sig Dispense Refill  . cetirizine (ZYRTEC) 10 MG tablet Take 10 mg by mouth daily.    Marland Kitchen levonorgestrel (MIRENA) 20 MCG/24HR IUD 1 each by Intrauterine route once.     No current facility-administered medications for this visit.     ALLERGIES: Amoxicillin  Family History  Problem Relation Age of Onset  . Emphysema Mother   . COPD Mother        Emphysema - died   . Varicose Veins Mother   . Coronary artery disease Father   . Heart disease Father        Bypass, Mitral replacement, pacemaker   . Hyperlipidemia Father   . Hypertension Father   . Coronary artery disease Maternal Grandfather   . Heart disease Maternal Grandfather   . Cervical cancer Paternal Grandmother   . Cancer Paternal Grandmother   . Birth defects Paternal Grandfather     Social History   Socioeconomic History  . Marital status: Married    Spouse name: Not on file  . Number  of children: 3  . Years of education: Not on file  . Highest education level: Not on file  Occupational History  . Occupation: Solicitor  Tobacco Use  . Smoking status: Never Smoker  . Smokeless tobacco: Never Used  Vaping Use  . Vaping Use: Never used  Substance and Sexual Activity  . Alcohol use: Yes    Alcohol/week: 1.0 standard drink    Types: 1 Standard drinks or equivalent per week  . Drug use: Never  . Sexual activity: Yes    Partners: Male    Birth control/protection: I.U.D.    Comment: inserted 07-24-16 Mirena   Other Topics Concern  . Not on file  Social History Narrative  . Not on file   Social Determinants of Health   Financial Resource Strain:   . Difficulty of Paying Living Expenses: Not on file  Food Insecurity:   . Worried About Programme researcher, broadcasting/film/video in the Last Year: Not on file  . Ran Out of Food in the Last Year: Not on file  Transportation Needs:   . Lack of Transportation (Medical): Not on file  . Lack of Transportation (Non-Medical): Not on file  Physical Activity:   .  Days of Exercise per Week: Not on file  . Minutes of Exercise per Session: Not on file  Stress:   . Feeling of Stress : Not on file  Social Connections:   . Frequency of Communication with Friends and Family: Not on file  . Frequency of Social Gatherings with Friends and Family: Not on file  . Attends Religious Services: Not on file  . Active Member of Clubs or Organizations: Not on file  . Attends Banker Meetings: Not on file  . Marital Status: Not on file  Intimate Partner Violence:   . Fear of Current or Ex-Partner: Not on file  . Emotionally Abused: Not on file  . Physically Abused: Not on file  . Sexually Abused: Not on file    Review of Systems  All other systems reviewed and are negative.   PHYSICAL EXAMINATION:    BP 116/64   Pulse 67   Wt 156 lb 12.8 oz (71.1 kg)   SpO2 100%   BMI 26.91 kg/m     General appearance: alert, cooperative and appears stated age  Pelvic: External genitalia:  no lesions              Urethra:  normal appearing urethra with no masses, tenderness or lesions              Bartholins and Skenes: normal                 Vagina: normal appearing vagina with normal color and discharge, no lesions. Grade 1-2 cystocele, small grade 2 rectocele, minimal uterine prolapse. Patient examined supine and standing with and without valsalva.              Cervix: no lesions and IUD string under 1 cm              Bimanual Exam:  Uterus:  normal size, contour, position, consistency, mobility, non-tender and retroverted              Adnexa: no mass, fullness, tenderness               Chaperone was present for exam.  ASSESSMENT Small rectocele, mild cystocele. No significant change in rectocele Vaginal dryness    PLAN Discussed prolapse, no treatment is needed  unless she is having symptoms. Avoid heavy lifting and straining Try Replens, I think this may make a difference. If her vagina is dry she will be more aware of prolapse.  Discussed  Kegels Given information on prolapse Discussed options of pessary and surgery. Discussed that her prolapse appears mild currently, but may be worse when she is on her feet all day. She should come back after a busy day if she thinks that makes a difference in her prolapse.    An After Visit Summary was printed and given to the patient.  ~31 minutes in total patient care

## 2019-12-23 LAB — HUMAN PAPILLOMAVIRUS, HIGH RISK: HPV DNA High Risk: NOT DETECTED

## 2019-12-23 LAB — PAP IG W/ RFLX HPV ASCU

## 2019-12-27 DIAGNOSIS — H401111 Primary open-angle glaucoma, right eye, mild stage: Secondary | ICD-10-CM | POA: Diagnosis not present

## 2020-01-06 ENCOUNTER — Ambulatory Visit
Admission: RE | Admit: 2020-01-06 | Discharge: 2020-01-06 | Disposition: A | Discharge status: Home / Self Care | Payer: BLUE CROSS/BLUE SHIELD | Source: Ambulatory Visit

## 2020-01-06 ENCOUNTER — Other Ambulatory Visit: Payer: Self-pay

## 2020-01-06 DIAGNOSIS — Z1231 Encounter for screening mammogram for malignant neoplasm of breast: Secondary | ICD-10-CM | POA: Diagnosis not present

## 2020-03-14 ENCOUNTER — Other Ambulatory Visit: Payer: Self-pay

## 2020-03-14 ENCOUNTER — Encounter: Payer: Self-pay | Admitting: Family Medicine

## 2020-03-14 ENCOUNTER — Ambulatory Visit (INDEPENDENT_AMBULATORY_CARE_PROVIDER_SITE_OTHER): Payer: BC Managed Care – PPO | Admitting: Family Medicine

## 2020-03-14 VITALS — BP 118/74 | HR 68 | Temp 97.9°F | Resp 14 | Ht 64.0 in | Wt 159.0 lb

## 2020-03-14 DIAGNOSIS — H409 Unspecified glaucoma: Secondary | ICD-10-CM | POA: Diagnosis not present

## 2020-03-14 DIAGNOSIS — Z0001 Encounter for general adult medical examination with abnormal findings: Secondary | ICD-10-CM

## 2020-03-14 DIAGNOSIS — Z8249 Family history of ischemic heart disease and other diseases of the circulatory system: Secondary | ICD-10-CM

## 2020-03-14 DIAGNOSIS — Z1211 Encounter for screening for malignant neoplasm of colon: Secondary | ICD-10-CM

## 2020-03-14 DIAGNOSIS — Z1159 Encounter for screening for other viral diseases: Secondary | ICD-10-CM

## 2020-03-14 DIAGNOSIS — Z0000 Encounter for general adult medical examination without abnormal findings: Secondary | ICD-10-CM

## 2020-03-14 NOTE — Progress Notes (Signed)
   Subjective:    Patient ID: Sheri Madden, female    DOB: [birth year], 49 y.o.   MRN: [id number]  Patient presents for Annual Exam (Is fasting/)  Pt here for CPE   meds reviewed   Due for fasting labs    Seen by GYN for rectocele   PAP Smear and Mammogram UTD    Runs 10K and half marthons / she does some kettle ball/resistance / ellipital and short runs through the week   Reviewed labs at [facility] TC 185 LDL 113 HDL 52  Non smoker  Eye doctor - UTD  Dentist- Due   Family history of early CAD, father MI/Bypass age 46, Brother 29 CAD with 2 stents   Review Of Systems:  GEN- denies fatigue, fever, weight loss,weakness, recent illness HEENT- denies eye drainage, change in vision, nasal discharge, CVS- denies chest pain, palpitations RESP- denies SOB, cough, wheeze ABD- denies N/V, change in stools, abd pain GU- denies dysuria, hematuria, dribbling, incontinence MSK- denies joint pain, muscle aches, injury Neuro- denies headache, dizziness, syncope, seizure activity       Objective:    BP 118/74   Pulse 68   Temp 97.9 F (36.6 C) (Temporal)   Resp 14   Ht 5\' 4"  (1.626 m)   Wt 159 lb (72.1 kg)   SpO2 98%   BMI 27.29 kg/m  GEN- NAD, alert and oriented x3 HEENT- PERRL, EOMI, non injected sclera, pink conjunctiva, MMM, oropharynx clear Neck- Supple, no thyromegaly CVS- RRR, no murmur RESP-CTAB ABD-NABS,soft,NT,ND EXT- No edema Pulses- Radial, DP- 2+        Assessment & Plan:      Problem List Items Addressed This Visit      Unprioritized   Glaucoma, right eye    Visits every 6 months, on drops Only has in 1 eye       Relevant Medications   latanoprost (XALATAN) 0.005 % ophthalmic solution    Other Visit Diagnoses    Routine general medical examination at a health care facility    -  Primary   CPE done, cologuard, discussed COVID-19 vaccine, shingles vaccine   Relevant Orders   CBC with Differential/Platelet   Family history of early CAD        Relevant Orders   Ambulatory referral to Cardiology   Need for hepatitis C screening test       Relevant Orders   Hepatitis C antibody   Colon cancer screening          Note: This dictation was prepared with Dragon dictation along with smaller phrase technology. Any transcriptional errors that result from this process are unintentional.

## 2020-03-14 NOTE — Patient Instructions (Signed)
Referral to cardiology Ask about shingles vaccine at pharmacy We will call with results Cologuard to be done  F/U 1 year for Physical

## 2020-03-14 NOTE — Assessment & Plan Note (Signed)
Visits every 6 months, on drops Only has in 1 eye

## 2020-03-15 ENCOUNTER — Encounter: Payer: Self-pay | Admitting: *Deleted

## 2020-03-15 LAB — CBC WITH DIFFERENTIAL/PLATELET
Absolute Monocytes: 371 cells/uL (ref 200–950)
Basophils Absolute: 41 cells/uL (ref 0–200)
Basophils Relative: 0.7 %
Eosinophils Absolute: 99 cells/uL (ref 15–500)
Eosinophils Relative: 1.7 %
HCT: 44 % (ref 35.0–45.0)
Hemoglobin: 15 g/dL (ref 11.7–15.5)
Lymphs Abs: 2111 cells/uL (ref 850–3900)
MCH: 29.8 pg (ref 27.0–33.0)
MCHC: 34.1 g/dL (ref 32.0–36.0)
MCV: 87.5 fL (ref 80.0–100.0)
MPV: 12.7 fL — ABNORMAL HIGH (ref 7.5–12.5)
Monocytes Relative: 6.4 %
Neutro Abs: 3178 cells/uL (ref 1500–7800)
Neutrophils Relative %: 54.8 %
Platelets: 223 10*3/uL (ref 140–400)
RBC: 5.03 10*6/uL (ref 3.80–5.10)
RDW: 12.7 % (ref 11.0–15.0)
Total Lymphocyte: 36.4 %
WBC: 5.8 10*3/uL (ref 3.8–10.8)

## 2020-03-15 LAB — HEPATITIS C ANTIBODY
Hepatitis C Ab: NONREACTIVE
SIGNAL TO CUT-OFF: 0.01 (ref <1.00)

## 2020-03-21 DIAGNOSIS — H401111 Primary open-angle glaucoma, right eye, mild stage: Secondary | ICD-10-CM | POA: Diagnosis not present

## 2020-03-21 DIAGNOSIS — H40022 Open angle with borderline findings, high risk, left eye: Secondary | ICD-10-CM | POA: Diagnosis not present

## 2020-03-29 ENCOUNTER — Other Ambulatory Visit: Payer: Self-pay

## 2020-03-29 ENCOUNTER — Encounter: Payer: Self-pay | Admitting: Internal Medicine

## 2020-03-29 ENCOUNTER — Ambulatory Visit: Payer: BC Managed Care – PPO | Admitting: Internal Medicine

## 2020-03-29 VITALS — BP 118/86 | HR 46 | Ht 64.0 in | Wt 162.0 lb

## 2020-03-29 DIAGNOSIS — Z8249 Family history of ischemic heart disease and other diseases of the circulatory system: Secondary | ICD-10-CM | POA: Diagnosis not present

## 2020-03-29 DIAGNOSIS — E782 Mixed hyperlipidemia: Secondary | ICD-10-CM

## 2020-03-29 NOTE — Patient Instructions (Signed)
Medication Instructions:  Your physician recommends that you continue on your current medications as directed. Please refer to the Current Medication list given to you today.  *If you need a refill on your cardiac medications before your next appointment, please call your pharmacy*  Lab Work: None ordered today  If you have labs (blood work) drawn today and your tests are completely normal, you will receive your results only by: Marland Kitchen MyChart Message (if you have MyChart) OR . A paper copy in the mail If you have any lab test that is abnormal or we need to change your treatment, we will call you to review the results.  Testing/Procedures: Your physician has requested that you have a CT scan for calcium score.  Follow-Up: At [facility], you and your health needs are our priority.  As part of our continuing mission to provide you with exceptional heart care, we have created designated Provider Care Teams.  These Care Teams include your primary Cardiologist (physician) and Advanced Practice Providers (APPs -  Physician Assistants and Nurse Practitioners) who all work together to provide you with the care you need, when you need it.  Your next appointment:   2-3 month(s)  The format for your next appointment:   Virtual Visit   Provider:   Riley Lam, MD

## 2020-03-29 NOTE — Progress Notes (Signed)
Cardiology Office Note:    Date:  03/29/2020   ID:  Sheri Madden, DOB Sep 19, 1970, MRN [id number]  PCP:  Salley Scarlet, MD  [facility] Cardiologist:  No primary care provider on file.  [facility] Electrophysiologist:  None   Referring MD: Salley Scarlet, MD   CC: Primary Prevention Eval Consulted for the evaluation of primary prevention of coronary disease at the behest of Wheeler, Velna Hatchet, MD  History of Present Illness:    Sheri Madden is a 49 y.o. female with a FHx of early CAD who presents for evaluation.  Patient notes that she is feeling ok.  Has had no chest pain, chest pressure, chest tightness, chest stinging.  Occasional heart flutters that occur that last a couple of seconds, these occur and resolve spontaneously.  Patient exertion notable for running and working with 4 days of exercise at least (and does 4-5 miles on Saturdays) and feels no symptoms.  No shortness of breath, DOE.  No PND or orthopnea.  No weight gain, leg swelling , or abdominal swelling.  No syncope or near syncope.  Patient reports prior NO cardiac testing including  echo,  stress test,  heart catheterizations,  cardioversion,  ablations.  No history of pre-eclampsia.    Past Medical History:  Diagnosis Date  . Allergy    seasonal  . Family history of early CAD     Past Surgical History:  Procedure Laterality Date  . DILATION AND CURETTAGE OF UTERUS     1998 & 2001     Current Medications: Current Meds  Medication Sig  . 5-Hydroxytryptophan (5-HTP) 100 MG CAPS Take by mouth.  . cetirizine (ZYRTEC) 10 MG tablet Take 10 mg by mouth daily.  . cholecalciferol (VITAMIN D3) 25 MCG (1000 UNIT) tablet Take 1,000 Units by mouth daily.  Marland Kitchen L-Tyrosine 1000 MG TABS Take by mouth.  . latanoprost (XALATAN) 0.005 % ophthalmic solution   . levonorgestrel (MIRENA) 20 MCG/24HR IUD 1 each by Intrauterine route once.     Allergies:   Amoxicillin   Social History   Socioeconomic History   . Marital status: Married    Spouse name: Not on file  . Number of children: 3  . Years of education: Not on file  . Highest education level: Not on file  Occupational History  . Occupation: Solicitor  Tobacco Use  . Smoking status: Never Smoker  . Smokeless tobacco: Never Used  Vaping Use  . Vaping Use: Never used  Substance and Sexual Activity  . Alcohol use: Yes    Alcohol/week: 1.0 standard drink    Types: 1 Standard drinks or equivalent per week  . Drug use: Never  . Sexual activity: Yes    Partners: Male    Birth control/protection: I.U.D.    Comment: inserted 07-24-16 Mirena   Other Topics Concern  . Not on file  Social History Narrative  . 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  Family History: The patient's family history includes Birth defects in her paternal grandfather; COPD in her mother; Cancer in her father and paternal grandmother; Cervical cancer in her paternal grandmother; Coronary artery disease in her father and maternal grandfather; Emphysema in her mother;  Heart disease in her father and maternal grandfather; Heart disease (age of onset: 46) in her brother; Hyperlipidemia in her father; Hypertension in her father; Varicose Veins in her mother. History of coronary artery disease notable for father and brother and maternal grandfather. History of heart failure notable for no members.  Father had mitral valve replacement No history of cardiomyopathies including hypertrophic cardiomyopathy, left ventricular non-compaction, or arrhythmogenic right ventricular cardiomyopathy. History of arrhythmia notable for bradycardia requiring pacemaker in father. Denies family history of sudden cardiac death including drowning, car  accidents, or unexplained deaths in the family. No history of bicuspid aortic valve or aortic aneurysm or dissection.  ROS:   Please see the history of present illness.     All other systems reviewed and are negative.  EKGs/Labs/Other Studies Reviewed:    The following studies were reviewed today:  EKG:   03/29/20: Sinus Bradycardia rate 46 nonspecific TWI  Recent Labs: 03/14/2020: Hemoglobin 15.0; Platelets 223  Recent Lipid Panel    Component Value Date/Time   CHOL 196 08/11/2018 1139   CHOL 160 07/24/2017 1648   TRIG 117 08/11/2018 1139   HDL 44 (L) 08/11/2018 1139   HDL 40 07/24/2017 1648   CHOLHDL 4.5 08/11/2018 1139   LDLCALC 130 (H) 08/11/2018 1139   Risk Assessment/Calculations:     The 10-year ASCVD risk score Denman George DC Montez Hageman., et al., 2013) is: 1.4%   Values used to calculate the score:     Age: 71 years     Sex: Female     Is Non-Hispanic African American: No     Diabetic: No     Tobacco smoker: No     Systolic Blood Pressure: 118 mmHg     Is BP treated: No     HDL Cholesterol: 44 mg/dL     Total Cholesterol: 196 mg/dL  Physical Exam:    VS:  BP 118/86   Pulse (!) 46   Ht 5\' 4"  (1.626 m)   Wt 162 lb (73.5 kg)   SpO2 97%   BMI 27.81 kg/m     Wt Readings from Last 3 Encounters:  03/29/20 162 lb (73.5 kg)  03/14/20 159 lb (72.1 kg)  12/22/19 156 lb 12.8 oz (71.1 kg)    GEN:  Well nourished, well developed in no acute distress HEENT: Normal NECK: No JVD; No carotid bruits LYMPHATICS: No lymphadenopathy CARDIAC: RRR, no murmurs, rubs, gallops RESPIRATORY:  Clear to auscultation without rales, wheezing or rhonchi  ABDOMEN: Soft, non-tender, non-distended MUSCULOSKELETAL:  No edema; No deformity  SKIN: Warm and dry NEUROLOGIC:  Alert and oriented x 3 PSYCHIATRIC:  Normal affect   ASSESSMENT:    1. Mixed hyperlipidemia   2. Family history of coronary artery disease    PLAN:    In order of problems listed above:  HLD Family history of  CAD Cardiac Preventive Visit AHA Life's Simple Seven- People with at least five ideal Life's Simple 7 metrics had a 78% reduced risk for heart-related death compared to people with no ideal metrics. - Discussed recommendation of Mediterranean and Dash Diets; discussed the evidence behind vegan diets - Discussed recommendations for 150 minutes weekly exercise - Discussed the important of blood sugar and blood pressure control - offered CAC scoring and aggressive cholesterol management, this would change her LDL goal and warrant start in statin - stress the important of a smoke free environment - though BMI is not a perfect measurement in all patient populations; a BMI < 30 can improve  cardiac outcomes; present BMI is 28- keep up the great work  2-3 virtual visit follow up unless new symptoms or abnormal test results warranting change in plan  Medication Adjustments/Labs and Tests Ordered: Current medicines are reviewed at length with the patient today.  Concerns regarding medicines are outlined above.  No orders of the defined types were placed in this encounter.  No orders of the defined types were placed in this encounter.   There are no Patient Instructions on file for this visit.   Signed, Christell Constant, MD  03/29/2020 8:51 AM    [facility]

## 2020-04-07 ENCOUNTER — Telehealth: Payer: Self-pay

## 2020-04-07 ENCOUNTER — Other Ambulatory Visit: Payer: Self-pay

## 2020-04-07 ENCOUNTER — Ambulatory Visit (INDEPENDENT_AMBULATORY_CARE_PROVIDER_SITE_OTHER)
Admission: RE | Admit: 2020-04-07 | Discharge: 2020-04-07 | Disposition: A | Discharge status: Home / Self Care | Payer: Self-pay | Source: Ambulatory Visit | Attending: Internal Medicine | Admitting: Internal Medicine

## 2020-04-07 DIAGNOSIS — Z8249 Family history of ischemic heart disease and other diseases of the circulatory system: Secondary | ICD-10-CM

## 2020-04-07 DIAGNOSIS — E782 Mixed hyperlipidemia: Secondary | ICD-10-CM

## 2020-04-07 MED ORDER — ROSUVASTATIN CALCIUM 10 MG PO TABS: 10.0000 mg | ORAL_TABLET | Freq: Every day | ORAL | 3 refills | Status: DC

## 2020-04-07 NOTE — Telephone Encounter (Signed)
Pt aware of CT results and recommendations. Rosuvastatin 10mg  qd sent into preferred pharmacy. FLP and LFT orders placed to be drawn a few days before her virtual visit with Dr. in March.

## 2020-04-07 NOTE — Telephone Encounter (Signed)
-----   Message from Christell Constant, MD sent at 04/07/2020  2:40 PM EST ----- Results: Elevated CAC score Plan: Start rosuvastatin 10 mg PO Daily will see in three months for repeat labs and LFTs  Christell Constant, MD

## 2020-05-31 ENCOUNTER — Other Ambulatory Visit: Payer: Self-pay

## 2020-05-31 ENCOUNTER — Other Ambulatory Visit: Payer: BC Managed Care – PPO | Admitting: *Deleted

## 2020-05-31 DIAGNOSIS — E782 Mixed hyperlipidemia: Secondary | ICD-10-CM | POA: Diagnosis not present

## 2020-05-31 LAB — HEPATIC FUNCTION PANEL
ALT: 22 IU/L (ref 0–32)
AST: 21 IU/L (ref 0–40)
Albumin: 4.5 g/dL (ref 3.8–4.8)
Alkaline Phosphatase: 44 IU/L (ref 44–121)
Bilirubin Total: 0.5 mg/dL (ref 0.0–1.2)
Bilirubin, Direct: 0.14 mg/dL (ref 0.00–0.40)
Total Protein: 6.6 g/dL (ref 6.0–8.5)

## 2020-05-31 LAB — LIPID PANEL
Chol/HDL Ratio: 2.5 ratio (ref 0.0–4.4)
Cholesterol, Total: 109 mg/dL (ref 100–199)
HDL: 43 mg/dL (ref >39)
LDL Chol Calc (NIH): 51 mg/dL (ref 0–99)
Triglycerides: 70 mg/dL (ref 0–149)
VLDL Cholesterol Cal: 15 mg/dL (ref 5–40)

## 2020-06-02 ENCOUNTER — Other Ambulatory Visit: Payer: Self-pay

## 2020-06-02 ENCOUNTER — Encounter: Payer: Self-pay | Admitting: Internal Medicine

## 2020-06-02 ENCOUNTER — Telehealth: Payer: Self-pay | Admitting: *Deleted

## 2020-06-02 ENCOUNTER — Telehealth (INDEPENDENT_AMBULATORY_CARE_PROVIDER_SITE_OTHER): Payer: BC Managed Care – PPO | Admitting: Internal Medicine

## 2020-06-02 VITALS — Ht 64.0 in | Wt 156.0 lb

## 2020-06-02 DIAGNOSIS — E782 Mixed hyperlipidemia: Secondary | ICD-10-CM

## 2020-06-02 DIAGNOSIS — Z8249 Family history of ischemic heart disease and other diseases of the circulatory system: Secondary | ICD-10-CM

## 2020-06-02 NOTE — Progress Notes (Signed)
Cardiology Office Note:    Date:  06/02/2020   ID:  Sheri Madden, DOB [birth year], MRN [id number]  PCP:  Salley Scarlet, MD  [facility] Cardiologist:  No primary care provider on file.  [facility] Electrophysiologist:  None   Referring MD: Salley Scarlet, MD    Virtual Visit via Video Note   This visit type was conducted due to national recommendations for restrictions regarding the COVID-19 Pandemic (e.g. social distancing) in an effort to limit this patient's exposure and mitigate transmission in our community.  Due to her co-morbid illnesses, this patient is at least at moderate risk for complications without adequate follow up.  This format is felt to be most appropriate for this patient at this time.  All issues noted in this document were discussed and addressed.  A limited physical exam was performed with this format.  Please refer to the patient's chart for her consent to telehealth for [facility].  Date:  06/02/2020   ID:  Sheri Madden, DOB [birth year], MRN [id number] The patient was identified using 2 identifiers.  Patient Location: Home Provider Location: Office/Clinic   Evaluation Performed:  Follow-Up Visit  Chief Complaint:  Primary Prevention Follow up  History of Present Illness:    Sheri Madden is a 50 y.o. female with a FHx of early CAD who presents for evaluation 03/29/20.  Since last visit had increase in her exercise.  Had elevated CAC and started statin therapy- seen as video visit 06/02/20.  Patient notes that she is doing well.  Is getting back to running 3+ miles without issues.  Since last visit notes no myalagias.  Relevant interval testing or therapy include starting statin without issue.  There are no interval hospital/ED visit.    No chest pain or pressure .  No SOB/DOE and no PND/Orthopnea.  No weight gain or leg swelling.  No palpitations or syncope .  Past Medical History:  Diagnosis Date  . Allergy    seasonal  . Family history  of early CAD     Past Surgical History:  Procedure Laterality Date  . DILATION AND CURETTAGE OF UTERUS     1998 & 2001     Current Medications: Current Meds  Medication Sig  . cetirizine (ZYRTEC) 10 MG tablet Take 10 mg by mouth daily.  . cholecalciferol (VITAMIN D3) 25 MCG (1000 UNIT) tablet Take 1,000 Units by mouth daily.  Marland Kitchen latanoprost (XALATAN) 0.005 % ophthalmic solution   . levonorgestrel (MIRENA) 20 MCG/24HR IUD 1 each by Intrauterine route once.  . rosuvastatin (CRESTOR) 10 MG tablet Take 1 tablet (10 mg total) by mouth daily.     Allergies:   Amoxicillin   Social History   Socioeconomic History  . Marital status: Married    Spouse name: Not on file  . Number of children: 3  . Years of education: Not on file  . Highest education level: Not on file  Occupational History  . Occupation: Solicitor  Tobacco Use  . Smoking status: Never Smoker  . Smokeless tobacco: Never Used  Vaping Use  . Vaping Use: Never used  Substance and Sexual Activity  . Alcohol use: Yes    Alcohol/week: 1.0 standard drink    Types: 1 Standard drinks or equivalent per week  . Drug use: Never  . Sexual activity: Yes    Partners: Male    Birth control/protection: I.U.D.    Comment: inserted 07-24-16 Mirena   Other Topics Concern  .  Not on file  Social History Narrative  . 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  Family History: The patient's family history includes Birth defects in her paternal grandfather; COPD in her mother; Cancer in her father and paternal grandmother; Cervical cancer in her paternal grandmother; Coronary artery disease in her father and maternal grandfather; Emphysema in her mother; Heart disease in her father and  maternal grandfather; Heart disease (age of onset: 65) in her brother; Hyperlipidemia in her father; Hypertension in her father; Varicose Veins in her mother. History of coronary artery disease notable for father and brother and maternal grandfather. History of heart failure notable for no members.  Father had mitral valve replacement No history of cardiomyopathies including hypertrophic cardiomyopathy, left ventricular non-compaction, or arrhythmogenic right ventricular cardiomyopathy. History of arrhythmia notable for bradycardia requiring pacemaker in father. Denies family history of sudden cardiac death including drowning, car accidents, or unexplained deaths in the family. No history of bicuspid aortic valve or aortic aneurysm or dissection.  ROS:   Please see the history of present illness.     All other systems reviewed and are negative.  EKGs/Labs/Other Studies Reviewed:    The following studies were reviewed today:  EKG:   03/29/20: Sinus Bradycardia rate 46 nonspecific TWI  NonCardiac CT : Date:04/07/20 Results: IMPRESSION: 1. Coronary calcium score of 26. This was 94th percentile for age, gender, and race matched controls.  Riley Lam, MD   Recent Labs: 03/14/2020: Hemoglobin 15.0; Platelets 223 05/31/2020: ALT 22  Recent Lipid Panel    Component Value Date/Time   CHOL 109 05/31/2020 0730   TRIG 70 05/31/2020 0730   HDL 43 05/31/2020 0730   CHOLHDL 2.5 05/31/2020 0730   CHOLHDL 4.5 08/11/2018 1139   LDLCALC 51 05/31/2020 0730   LDLCALC 130 (H) 08/11/2018 1139   Risk Assessment/Calculations:     N/A  Physical Exam:    VS:  Ht 5\' 4"  (1.626 m)   Wt 156 lb (70.8 kg)   BMI 26.78 kg/m     Wt Readings from Last 3 Encounters:  06/02/20 156 lb (70.8 kg)  03/29/20 162 lb (73.5 kg)  03/14/20 159 lb (72.1 kg)    GEN:  Well nourished, well developed in no acute distress HEENT: Normal NECK: No JVD RESPIRATORY:  No tachpynea SKIN: Warm and  dry NEUROLOGIC:  Alert and oriented x 3 PSYCHIATRIC:  Normal affect   ASSESSMENT:    1. Mixed hyperlipidemia   2. Family history of coronary artery disease    PLAN:    In order of problems listed above:  HLD Family history of CAD Cardiac Preventive Visit AHA Life's Simple Seven- People with at least five ideal Life's Simple 7 metrics had a 78% reduced risk for heart-related death compared to people with no ideal metrics. - Discussed recommendation of Mediterranean and Dash Diets; discussed the evidence behind vegan diets - patients goal  to come off medication if feasible; we will try to do that in one year pending recheck and will try plant based diest - Discussed recommendations for 150 minutes weekly exercise - Discussed the important of blood sugar and blood pressure control (discussed masked HTN) - continue rosuvastatin10 mg  - stress the important  of a smoke free environment - though BMI is not a perfect measurement in all patient populations; a BMI < 30 can improve cardiac outcomes; present BMI is 26- keep up the great work  One year follow up unless new symptoms or abnormal test results warranting change in plan  Would be reasonable for  APP Follow up   Medication Adjustments/Labs and Tests Ordered: Current medicines are reviewed at length with the patient today.  Concerns regarding medicines are outlined above.  Orders Placed This Encounter  Procedures  . Lipid panel  . Hepatic function panel   No orders of the defined types were placed in this encounter.   Patient Instructions  Medication Instructions:  Your physician recommends that you continue on your current medications as directed. Please refer to the Current Medication list given to you today.  *If you need a refill on your cardiac medications before your next appointment, please call your pharmacy*   Lab Work: IN 1 YEAR: fasting lipid panel and liver function test  If you have labs (blood work) drawn  today and your tests are completely normal, you will receive your results only by: Marland Kitchen MyChart Message (if you have MyChart) OR . A paper copy in the mail If you have any lab test that is abnormal or we need to change your treatment, we will call you to review the results.   Testing/Procedures: NONE   Follow-Up: At [facility], you and your health needs are our priority.  As part of our continuing mission to provide you with exceptional heart care, we have created designated Provider Care Teams.  These Care Teams include your primary Cardiologist (physician) and Advanced Practice Providers (APPs -  Physician Assistants and Nurse Practitioners) who all work together to provide you with the care you need, when you need it.  We recommend signing up for the patient portal called "MyChart".  Sign up information is provided on this After Visit Summary.  MyChart is used to connect with patients for Virtual Visits (Telemedicine).  Patients are able to view lab/test results, encounter notes, upcoming appointments, etc.  Non-urgent messages can be sent to your provider as well.   To learn more about what you can do with MyChart, go to [email or web address].    Your next appointment:   12 month(s)  The format for your next appointment:   In Person  Provider:   You may see Izora Ribas, MD or one of the following Advanced Practice Providers on your designated Care Team:    Ronie Spies, PA-C  Jacolyn Reedy, PA-C         Signed, Christell Constant, MD  06/02/2020 8:18 AM    [facility]

## 2020-06-02 NOTE — Patient Instructions (Signed)
Medication Instructions:  Your physician recommends that you continue on your current medications as directed. Please refer to the Current Medication list given to you today.  *If you need a refill on your cardiac medications before your next appointment, please call your pharmacy*   Lab Work: IN 1 YEAR: fasting lipid panel and liver function test  If you have labs (blood work) drawn today and your tests are completely normal, you will receive your results only by: Marland Kitchen MyChart Message (if you have MyChart) OR . A paper copy in the mail If you have any lab test that is abnormal or we need to change your treatment, we will call you to review the results.   Testing/Procedures: NONE   Follow-Up: At [facility], you and your health needs are our priority.  As part of our continuing mission to provide you with exceptional heart care, we have created designated Provider Care Teams.  These Care Teams include your primary Cardiologist (physician) and Advanced Practice Providers (APPs -  Physician Assistants and Nurse Practitioners) who all work together to provide you with the care you need, when you need it.  We recommend signing up for the patient portal called "MyChart".  Sign up information is provided on this After Visit Summary.  MyChart is used to connect with patients for Virtual Visits (Telemedicine).  Patients are able to view lab/test results, encounter notes, upcoming appointments, etc.  Non-urgent messages can be sent to your provider as well.   To learn more about what you can do with MyChart, go to [email or web address].    Your next appointment:   12 month(s)  The format for your next appointment:   In Person  Provider:   You may see Izora Ribas, MD or one of the following Advanced Practice Providers on your designated Care Team:    Ronie Spies, PA-C  Jacolyn Reedy, PA-C

## 2020-06-02 NOTE — Telephone Encounter (Signed)
  Patient Consent for Virtual Visit         Sheri Madden has provided verbal consent on 06/02/2020 for a virtual visit (video or telephone).   CONSENT FOR VIRTUAL VISIT FOR:  Sheri Madden  By participating in this virtual visit I agree to the following:  I hereby voluntarily request, consent and authorize [facility] and its employed or contracted physicians, physician assistants, nurse practitioners or other licensed health care professionals (the Practitioner), to provide me with telemedicine health care services (the "Services") as deemed necessary by the treating Practitioner. I acknowledge and consent to receive the Services by the Practitioner via telemedicine. I understand that the telemedicine visit will involve communicating with the Practitioner through live audiovisual communication technology and the disclosure of certain medical information by electronic transmission. I acknowledge that I have been given the opportunity to request an in-person assessment or other available alternative prior to the telemedicine visit and am voluntarily participating in the telemedicine visit.  I understand that I have the right to withhold or withdraw my consent to the use of telemedicine in the course of my care at any time, without affecting my right to future care or treatment, and that the Practitioner or I may terminate the telemedicine visit at any time. I understand that I have the right to inspect all information obtained and/or recorded in the course of the telemedicine visit and may receive copies of available information for a reasonable fee.  I understand that some of the potential risks of receiving the Services via telemedicine include:  Marland Kitchen Delay or interruption in medical evaluation due to technological equipment failure or disruption; . Information transmitted may not be sufficient (e.g. poor resolution of images) to allow for appropriate medical decision making by the Practitioner;  and/or  . In rare instances, security protocols could fail, causing a breach of personal health information.  Furthermore, I acknowledge that it is my responsibility to provide information about my medical history, conditions and care that is complete and accurate to the best of my ability. I acknowledge that Practitioner's advice, recommendations, and/or decision may be based on factors not within their control, such as incomplete or inaccurate data provided by me or distortions of diagnostic images or specimens that may result from electronic transmissions. I understand that the practice of medicine is not an exact science and that Practitioner makes no warranties or guarantees regarding treatment outcomes. I acknowledge that a copy of this consent can be made available to me via my patient portal [facility] MyChart), or I can request a printed copy by calling the office of [facility].    I understand that my insurance will be billed for this visit.   I have read or had this consent read to me. . I understand the contents of this consent, which adequately explains the benefits and risks of the Services being provided via telemedicine.  . I have been provided ample opportunity to ask questions regarding this consent and the Services and have had my questions answered to my satisfaction. . I give my informed consent for the services to be provided through the use of telemedicine in my medical care

## 2020-06-19 DIAGNOSIS — H401111 Primary open-angle glaucoma, right eye, mild stage: Secondary | ICD-10-CM | POA: Diagnosis not present

## 2020-06-19 DIAGNOSIS — H40022 Open angle with borderline findings, high risk, left eye: Secondary | ICD-10-CM | POA: Diagnosis not present

## 2020-09-22 DIAGNOSIS — H0288A Meibomian gland dysfunction right eye, upper and lower eyelids: Secondary | ICD-10-CM | POA: Diagnosis not present

## 2020-09-22 DIAGNOSIS — H401111 Primary open-angle glaucoma, right eye, mild stage: Secondary | ICD-10-CM | POA: Diagnosis not present

## 2020-09-22 DIAGNOSIS — H0288B Meibomian gland dysfunction left eye, upper and lower eyelids: Secondary | ICD-10-CM | POA: Diagnosis not present

## 2020-11-24 DIAGNOSIS — H0288B Meibomian gland dysfunction left eye, upper and lower eyelids: Secondary | ICD-10-CM | POA: Diagnosis not present

## 2020-11-24 DIAGNOSIS — H0288A Meibomian gland dysfunction right eye, upper and lower eyelids: Secondary | ICD-10-CM | POA: Diagnosis not present

## 2020-12-25 ENCOUNTER — Other Ambulatory Visit: Payer: Self-pay | Admitting: Obstetrics and Gynecology

## 2020-12-25 DIAGNOSIS — Z1231 Encounter for screening mammogram for malignant neoplasm of breast: Secondary | ICD-10-CM

## 2020-12-29 DIAGNOSIS — H401111 Primary open-angle glaucoma, right eye, mild stage: Secondary | ICD-10-CM | POA: Diagnosis not present

## 2020-12-29 DIAGNOSIS — H40022 Open angle with borderline findings, high risk, left eye: Secondary | ICD-10-CM | POA: Diagnosis not present

## 2021-01-10 DIAGNOSIS — H409 Unspecified glaucoma: Secondary | ICD-10-CM | POA: Diagnosis not present

## 2021-01-10 DIAGNOSIS — Z7689 Persons encountering health services in other specified circumstances: Secondary | ICD-10-CM | POA: Diagnosis not present

## 2021-01-10 DIAGNOSIS — E782 Mixed hyperlipidemia: Secondary | ICD-10-CM | POA: Diagnosis not present

## 2021-01-10 DIAGNOSIS — I5189 Other ill-defined heart diseases: Secondary | ICD-10-CM | POA: Diagnosis not present

## 2021-01-12 ENCOUNTER — Ambulatory Visit
Admission: RE | Admit: 2021-01-12 | Discharge: 2021-01-12 | Disposition: A | Discharge status: Home / Self Care | Payer: BC Managed Care – PPO | Source: Ambulatory Visit

## 2021-01-12 ENCOUNTER — Other Ambulatory Visit: Payer: Self-pay

## 2021-01-12 DIAGNOSIS — Z1231 Encounter for screening mammogram for malignant neoplasm of breast: Secondary | ICD-10-CM | POA: Diagnosis not present

## 2021-01-24 DIAGNOSIS — Z8639 Personal history of other endocrine, nutritional and metabolic disease: Secondary | ICD-10-CM | POA: Diagnosis not present

## 2021-01-24 DIAGNOSIS — R635 Abnormal weight gain: Secondary | ICD-10-CM | POA: Diagnosis not present

## 2021-01-24 DIAGNOSIS — N951 Menopausal and female climacteric states: Secondary | ICD-10-CM | POA: Diagnosis not present

## 2021-01-24 DIAGNOSIS — E785 Hyperlipidemia, unspecified: Secondary | ICD-10-CM | POA: Diagnosis not present

## 2021-02-02 DIAGNOSIS — H0288B Meibomian gland dysfunction left eye, upper and lower eyelids: Secondary | ICD-10-CM | POA: Diagnosis not present

## 2021-02-02 DIAGNOSIS — H0288A Meibomian gland dysfunction right eye, upper and lower eyelids: Secondary | ICD-10-CM | POA: Diagnosis not present

## 2021-02-02 DIAGNOSIS — E782 Mixed hyperlipidemia: Secondary | ICD-10-CM | POA: Diagnosis not present

## 2021-02-02 DIAGNOSIS — E669 Obesity, unspecified: Secondary | ICD-10-CM | POA: Diagnosis not present

## 2021-02-02 DIAGNOSIS — H16223 Keratoconjunctivitis sicca, not specified as Sjogren's, bilateral: Secondary | ICD-10-CM | POA: Diagnosis not present

## 2021-02-02 DIAGNOSIS — Z713 Dietary counseling and surveillance: Secondary | ICD-10-CM | POA: Diagnosis not present

## 2021-02-02 DIAGNOSIS — E559 Vitamin D deficiency, unspecified: Secondary | ICD-10-CM | POA: Diagnosis not present

## 2021-02-08 DIAGNOSIS — K5900 Constipation, unspecified: Secondary | ICD-10-CM | POA: Diagnosis not present

## 2021-02-08 DIAGNOSIS — R635 Abnormal weight gain: Secondary | ICD-10-CM | POA: Diagnosis not present

## 2021-02-08 DIAGNOSIS — Z1211 Encounter for screening for malignant neoplasm of colon: Secondary | ICD-10-CM | POA: Diagnosis not present

## 2021-03-05 DIAGNOSIS — E669 Obesity, unspecified: Secondary | ICD-10-CM | POA: Diagnosis not present

## 2021-03-05 DIAGNOSIS — Z713 Dietary counseling and surveillance: Secondary | ICD-10-CM | POA: Diagnosis not present

## 2021-03-07 ENCOUNTER — Encounter: Payer: Self-pay | Admitting: Obstetrics and Gynecology

## 2021-03-07 DIAGNOSIS — Z1211 Encounter for screening for malignant neoplasm of colon: Secondary | ICD-10-CM | POA: Diagnosis not present

## 2021-03-20 ENCOUNTER — Other Ambulatory Visit: Payer: Self-pay | Admitting: Internal Medicine

## 2021-03-20 DIAGNOSIS — E782 Mixed hyperlipidemia: Secondary | ICD-10-CM

## 2021-03-21 DIAGNOSIS — Z0000 Encounter for general adult medical examination without abnormal findings: Secondary | ICD-10-CM | POA: Diagnosis not present

## 2021-03-28 DIAGNOSIS — Z7189 Other specified counseling: Secondary | ICD-10-CM | POA: Diagnosis not present

## 2021-03-28 DIAGNOSIS — Z1159 Encounter for screening for other viral diseases: Secondary | ICD-10-CM | POA: Diagnosis not present

## 2021-03-28 DIAGNOSIS — K5900 Constipation, unspecified: Secondary | ICD-10-CM | POA: Diagnosis not present

## 2021-03-28 DIAGNOSIS — Z0000 Encounter for general adult medical examination without abnormal findings: Secondary | ICD-10-CM | POA: Diagnosis not present

## 2021-03-28 DIAGNOSIS — Z2821 Immunization not carried out because of patient refusal: Secondary | ICD-10-CM | POA: Diagnosis not present

## 2021-03-28 DIAGNOSIS — Z23 Encounter for immunization: Secondary | ICD-10-CM | POA: Diagnosis not present

## 2021-04-03 DIAGNOSIS — H401131 Primary open-angle glaucoma, bilateral, mild stage: Secondary | ICD-10-CM | POA: Diagnosis not present

## 2021-04-20 DIAGNOSIS — R1011 Right upper quadrant pain: Secondary | ICD-10-CM | POA: Diagnosis not present

## 2021-04-30 DIAGNOSIS — Z713 Dietary counseling and surveillance: Secondary | ICD-10-CM | POA: Diagnosis not present

## 2021-04-30 DIAGNOSIS — E669 Obesity, unspecified: Secondary | ICD-10-CM | POA: Diagnosis not present

## 2021-05-02 DIAGNOSIS — Z23 Encounter for immunization: Secondary | ICD-10-CM | POA: Diagnosis not present

## 2021-05-14 NOTE — Progress Notes (Signed)
51 y.o. [id number] Married White or Caucasian Not Hispanic or Latino female here for annual exam. She has a mirena IUD, placed in 4/18. Period Cycle (Days): 28 Period Duration (Days): 1-2 Period Pattern: Regular Menstrual Flow: Light Menstrual Control: Panty liner Menstrual Control Change Freq (Hours): 8 Dysmenorrhea: None Long term issues with night sweats, no change.  Sexually active, some dryness, uses a lubricant sometimes.   H/O rectocele, not bothering her. BM are every 1-2 days, not straining.   No LMP recorded. (Menstrual status: IUD).          Sexually active: Yes.    The current method of family planning is IUD.    Exercising: Yes.     Walking  Smoker:  no  Health Maintenance: Pap:  12/20/19 ASCUS Hr HPV Neg, 08/11/18 WNL  History of abnormal Pap:  no MMG:  01/17/21 density B Bi-rads1 neg  BMD:   none  Colonoscopy: 12/22, normal.  TDaP:  03/2021 Gardasil: n/a   reports that she has never smoked. She has never used smokeless tobacco. She reports current alcohol use of about 1.0 standard drink per week. She reports that she does not use drugs. 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 She is working 5, 10-11 hours days and is on call 24/7. Kids are 16, 19 and 27. Husband has 4 kids, his 25 and 25 year old with them.   Past Medical History:  Diagnosis Date   Allergy    seasonal   Family history of early CAD     Past Surgical History:  Procedure Laterality Date   DILATION AND CURETTAGE OF UTERUS     1998 & 2001     Current Outpatient Medications  Medication Sig Dispense Refill   cetirizine (ZYRTEC) 10 MG tablet Take 10 mg by mouth daily.     cholecalciferol (VITAMIN D3) 25 MCG (1000 UNIT) tablet Take 1,000 Units by mouth daily.     latanoprost (XALATAN) 0.005 % ophthalmic solution      levonorgestrel (MIRENA) 20 MCG/24HR IUD 1 each by Intrauterine route once.     MOUNJARO 5 MG/0.5ML Pen SMARTSIG:5 Milligram(s) SUB-Q Once a Week      rosuvastatin (CRESTOR) 10 MG tablet TAKE 1 TABLET DAILY 90 tablet 0   XIIDRA 5 % SOLN Apply 1 drop to eye 2 (two) times daily.     No current facility-administered medications for this visit.    Family History  Problem Relation Age of Onset   Emphysema Mother    COPD Mother        Emphysema - died    Varicose Veins Mother    Coronary artery disease Father    Heart disease Father        Bypass, Mitral replacement, pacemaker    Hyperlipidemia Father    Hypertension Father    Cancer Father        Esophogeal   Coronary artery disease Maternal Grandfather    Heart disease Maternal Grandfather    Cervical cancer Paternal Grandmother    Cancer Paternal Grandmother    Heart disease Brother 68       CAD, 2 stents   Birth defects Paternal Grandfather     Review of Systems  Genitourinary:        Vaginal dryness.   All other systems reviewed and are negative.  Exam:   BP 122/85    Pulse 72    Ht 5\' 4"  (1.626 m)    Wt  152 lb (68.9 kg)    SpO2 99%    BMI 26.09 kg/m   Weight change: @WEIGHTCHANGE @ Height:   Height: 5\' 4"  (162.6 cm)  Ht Readings from Last 3 Encounters:  05/22/21 5\' 4"  (1.626 m)  06/02/20 5\' 4"  (1.626 m)  03/29/20 5\' 4"  (1.626 m)    General appearance: alert, cooperative and appears stated age Head: Normocephalic, without obvious abnormality, atraumatic Neck: no adenopathy, supple, symmetrical, trachea midline and thyroid normal to inspection and palpation Lungs: clear to auscultation bilaterally Cardiovascular: regular rate and rhythm Breasts: normal appearance, no masses or tenderness Abdomen: soft, non-tender; non distended,  no masses,  no organomegaly Extremities: extremities normal, atraumatic, no cyanosis or edema Skin: Skin color, texture, turgor normal. No rashes or lesions Lymph nodes: Cervical, supraclavicular, and axillary nodes normal. No abnormal inguinal nodes palpated Neurologic: Grossly normal   Pelvic: External genitalia:  no lesions               Urethra:  normal appearing urethra with no masses, tenderness or lesions              Bartholins and Skenes: normal                 Vagina: normal appearing vagina with normal color and discharge, no lesions. Grade 1-2 cystocele, grade 1 uterine prolapse, small grade 2 rectocele              Cervix: no lesions, IUD string 1 cm               Bimanual Exam:  Uterus:  normal size, contour, position, consistency, mobility, non-tender and retroverted              Adnexa: no mass, fullness, tenderness               Rectovaginal: Confirms               Anus:  normal sphincter tone, fissure noted at 2 o'clock  Gae Dry chaperoned for the exam.  1. Well woman exam Pap next year Mammogram and colonoscopy are UTD Labs with primary Discussed breast self exam Discussed calcium and vit D intake Try Magnesium for mild constipation  2. Other female genital prolapse Mild, not symptomatic. Avoid heavy lifting and straining  3. IUD check up Doing well Due for removal in 4/26

## 2021-05-22 ENCOUNTER — Encounter: Payer: Self-pay | Admitting: Obstetrics and Gynecology

## 2021-05-22 ENCOUNTER — Ambulatory Visit (INDEPENDENT_AMBULATORY_CARE_PROVIDER_SITE_OTHER): Payer: BC Managed Care – PPO | Admitting: Obstetrics and Gynecology

## 2021-05-22 ENCOUNTER — Other Ambulatory Visit: Payer: Self-pay

## 2021-05-22 VITALS — BP 122/85 | HR 72 | Ht 64.0 in | Wt 152.0 lb

## 2021-05-22 DIAGNOSIS — N8189 Other female genital prolapse: Secondary | ICD-10-CM

## 2021-05-22 DIAGNOSIS — Z01419 Encounter for gynecological examination (general) (routine) without abnormal findings: Secondary | ICD-10-CM

## 2021-05-22 DIAGNOSIS — Z30431 Encounter for routine checking of intrauterine contraceptive device: Secondary | ICD-10-CM

## 2021-05-22 NOTE — Patient Instructions (Addendum)
Try uberlube for vaginal lubrication  Try Magnesium 500 mg a day for constipation.   EXERCISE   We recommended that you start or continue a regular exercise program for good health. Physical activity is anything that gets your body moving, some is better than none. The CDC recommends 150 minutes per week of Moderate-Intensity Aerobic Activity and 2 or more days of Muscle Strengthening Activity.  Benefits of exercise are limitless: helps weight loss/weight maintenance, improves mood and energy, helps with depression and anxiety, improves sleep, tones and strengthens muscles, improves balance, improves bone density, protects from chronic conditions such as heart disease, high blood pressure and diabetes and so much more. To learn more visit: http://kirby-bean.org/  DIET: Good nutrition starts with a healthy diet of fruits, vegetables, whole grains, and lean protein sources. Drink plenty of water for hydration. Minimize empty calories, sodium, sweets. For more information about dietary recommendations visit: CriticalGas.be and https://www.carpenter-henry.info/  ALCOHOL:  Women should limit their alcohol intake to no more than 7 drinks/beers/glasses of wine (combined, not each!) per week. Moderation of alcohol intake to this level decreases your risk of breast cancer and liver damage.  If you are concerned that you may have a problem, or your friends have told you they are concerned about your drinking, there are many resources to help. A well-known program that is free, effective, and available to all people all over the nation is Alcoholics Anonymous.  Check out this site to learn more: BeverageBargains.co.za   CALCIUM AND VITAMIN D:  Adequate intake of calcium and Vitamin D are recommended for bone health.  You should be getting between 1000-1200 mg of calcium and 800 units of Vitamin D daily between diet and supplements  PAP  SMEARS:  Pap smears, to check for cervical cancer or precancers,  have traditionally been done yearly, scientific advances have shown that most women can have pap smears less often.  However, every woman still should have a physical exam from her gynecologist every year. It will include a breast check, inspection of the vulva and vagina to check for abnormal growths or skin changes, a visual exam of the cervix, and then an exam to evaluate the size and shape of the uterus and ovaries. We will also provide age appropriate advice regarding health maintenance, like when you should have certain vaccines, screening for sexually transmitted diseases, bone density testing, colonoscopy, mammograms, etc.   MAMMOGRAMS:  All women over 43 years old should have a routine mammogram.   COLON CANCER SCREENING: Now recommend starting at age 50. At this time colonoscopy is not covered for routine screening until 50. There are take home tests that can be done between 45-49.   COLONOSCOPY:  Colonoscopy to screen for colon cancer is recommended for all women at age 47.  We know, you hate the idea of the prep.  We agree, BUT, having colon cancer and not knowing it is worse!!  Colon cancer so often starts as a polyp that can be seen and removed at colonscopy, which can quite literally save your life!  And if your first colonoscopy is normal and you have no family history of colon cancer, most women don't have to have it again for 10 years.  Once every ten years, you can do something that may end up saving your life, right?  We will be happy to help you get it scheduled when you are ready.  Be sure to check your insurance coverage so you understand how much it will cost.  It may be covered as a preventative service at no cost, but you should check your particular policy.      Breast Self-Awareness Breast self-awareness means being familiar with how your breasts look and feel. It involves checking your breasts regularly and  reporting any changes to your health care provider. Practicing breast self-awareness is important. A change in your breasts can be a sign of a serious medical problem. Being familiar with how your breasts look and feel allows you to find any problems early, when treatment is more likely to be successful. All women should practice breast self-awareness, including women who have had breast implants. How to do a breast self-exam One way to learn what is normal for your breasts and whether your breasts are changing is to do a breast self-exam. To do a breast self-exam: Look for Changes  Remove all the clothing above your waist. Stand in front of a mirror in a room with good lighting. Put your hands on your hips. Push your hands firmly downward. Compare your breasts in the mirror. Look for differences between them (asymmetry), such as: Differences in shape. Differences in size. Puckers, dips, and bumps in one breast and not the other. Look at each breast for changes in your skin, such as: Redness. Scaly areas. Look for changes in your nipples, such as: Discharge. Bleeding. Dimpling. Redness. A change in position. Feel for Changes Carefully feel your breasts for lumps and changes. It is best to do this while lying on your back on the floor and again while sitting or standing in the shower or tub with soapy water on your skin. Feel each breast in the following way: Place the arm on the side of the breast you are examining above your head. Feel your breast with the other hand. Start in the nipple area and make  inch (2 cm) overlapping circles to feel your breast. Use the pads of your three middle fingers to do this. Apply light pressure, then medium pressure, then firm pressure. The light pressure will allow you to feel the tissue closest to the skin. The medium pressure will allow you to feel the tissue that is a little deeper. The firm pressure will allow you to feel the tissue close to the  ribs. Continue the overlapping circles, moving downward over the breast until you feel your ribs below your breast. Move one finger-width toward the center of the body. Continue to use the  inch (2 cm) overlapping circles to feel your breast as you move slowly up toward your collarbone. Continue the up and down exam using all three pressures until you reach your armpit.  Write Down What You Find  Write down what is normal for each breast and any changes that you find. Keep a written record with breast changes or normal findings for each breast. By writing this information down, you do not need to depend only on memory for size, tenderness, or location. Write down where you are in your menstrual cycle, if you are still menstruating. If you are having trouble noticing differences in your breasts, do not get discouraged. With time you will become more familiar with the variations in your breasts and more comfortable with the exam. How often should I examine my breasts? Examine your breasts every month. If you are breastfeeding, the best time to examine your breasts is after a feeding or after using a breast pump. If you menstruate, the best time to examine your breasts is 5-7 days after your  period is over. During your period, your breasts are lumpier, and it may be more difficult to notice changes. When should I see my health care provider? See your health care provider if you notice: A change in shape or size of your breasts or nipples. A change in the skin of your breast or nipples, such as a reddened or scaly area. Unusual discharge from your nipples. A lump or thick area that was not there before. Pain in your breasts. Anything that concerns you.

## 2021-05-31 NOTE — Progress Notes (Signed)
?Cardiology Office Note:   ? ?Date:  06/01/2021  ? ?ID:  Sheri Madden, DOB 12/30/1970, MRN [id number] ? ?PCP:  Lewis Moccasin, MD  ?[facility] Cardiologist:  None  ?[facility] Electrophysiologist:  None  ? ?Referring MD: Salley Scarlet, MD  ? ?CC: Follow up CAC ? ?History of Present Illness:   ? ?Sheri Madden is a 51 y.o. female with a FHx of early CAD and CAC.  Started therapy in 2022.  Seen 2023. ? ?Patient notes that she is doing poorly largely due to her job; they are restarting her plan and she is the site Production designer, theatre/television/film.   ?Mindi Curling and is doing well. ?There are no interval hospital/ED visit.   ? ?No chest pain or pressure .  No SOB/DOE and no PND/Orthopnea.  No weight gain or leg swelling.  No palpitations or syncope. ? ? ?Past Medical History:  ?Diagnosis Date  ? Allergy   ? seasonal  ? Family history of early CAD   ? ? ?Past Surgical History:  ?Procedure Laterality Date  ? DILATION AND CURETTAGE OF UTERUS    ? 1998 & 2001   ? ? ?Current Medications: ?Current Meds  ?Medication Sig  ? cetirizine (ZYRTEC) 10 MG tablet Take 10 mg by mouth daily.  ? cholecalciferol (VITAMIN D3) 25 MCG (1000 UNIT) tablet Take 1,000 Units by mouth daily.  ? latanoprost (XALATAN) 0.005 % ophthalmic solution   ? levonorgestrel (MIRENA) 20 MCG/24HR IUD 1 each by Intrauterine route once.  ? MOUNJARO 5 MG/0.5ML Pen SMARTSIG:5 Milligram(s) SUB-Q Once a Week  ? rosuvastatin (CRESTOR) 10 MG tablet TAKE 1 TABLET DAILY  ? XIIDRA 5 % SOLN Apply 1 drop to eye 2 (two) times daily.  ?  ? ?Allergies:   Amoxicillin  ? ?Social History  ? ?Socioeconomic History  ? Marital status: Married  ?  Spouse name: Not on file  ? Number of children: 3  ? Years of education: Not on file  ? Highest education level: Not on file  ?Occupational History  ? Occupation: Solicitor  ?Tobacco Use  ? Smoking status: Never  ? Smokeless tobacco: Never  ?Vaping Use  ? Vaping Use: Never used  ?Substance and Sexual Activity  ? Alcohol use: Yes  ?   Alcohol/week: 1.0 standard drink  ?  Types: 1 Standard drinks or equivalent per week  ? Drug use: Never  ? Sexual activity: Yes  ?  Partners: Male  ?  Birth control/protection: I.U.D.  ?  Comment: inserted 07-24-16 Mirena   ?Other Topics Concern  ? Not on file  ?Social History Narrative  ? Not on file  ? ?Social Determinants of Health  ? ?Financial Resource Strain: Not on file  ?Food Insecurity: Not on file  ?Transportation Needs: Not on file  ?Physical Activity: Not on file  ?Stress: Not on file  ?Social Connections: Not on file  ?  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 ? ?Family History: ?The patient's family history includes Birth defects in her paternal grandfather; COPD in her mother; Cancer in her father and paternal grandmother; Cervical cancer in her paternal grandmother; Coronary artery disease in her father and maternal grandfather; Emphysema in her mother; Heart disease in her father and maternal grandfather; Heart disease (age of onset: 50) in her brother; Hyperlipidemia in her father; Hypertension in her father; Varicose Veins in her mother. ?History of coronary artery disease notable for father and brother and maternal grandfather. ?History of heart failure notable for  no members.  Father had mitral valve replacement ?No history of cardiomyopathies including hypertrophic cardiomyopathy, left ventricular non-compaction, or arrhythmogenic right ventricular cardiomyopathy. ?History of arrhythmia notable for bradycardia requiring pacemaker in father. ?Denies family history of sudden cardiac death including drowning, car accidents, or unexplained deaths in the family. ?No history of bicuspid aortic valve or aortic aneurysm or dissection. ? ?ROS:   ?Please see the history of present illness.    ? All other systems reviewed and are negative. ? ?EKGs/Labs/Other Studies Reviewed:   ? ?The following studies were reviewed today: ? ?EKG:   ?06/01/21: SR rate 65  ?03/29/20: Sinus Bradycardia rate 46 nonspecific  TWI ? ?Recent Labs: ?No results found for requested labs within last 8760 hours.  ?Recent Lipid Panel ?   ?Component Value Date/Time  ? CHOL 109 05/31/2020 0730  ? TRIG 70 05/31/2020 0730  ? HDL 43 05/31/2020 0730  ? CHOLHDL 2.5 05/31/2020 0730  ? CHOLHDL 4.5 08/11/2018 1139  ? LDLCALC 51 05/31/2020 0730  ? LDLCALC 130 (H) 08/11/2018 1139  ? ? ?Physical Exam:   ? ?VS:  BP 130/88   Pulse 65   Ht 5\' 4"  (1.626 m)   Wt 150 lb 12.8 oz (68.4 kg)   BMI 25.88 kg/m?    ? ?Wt Readings from Last 3 Encounters:  ?06/01/21 150 lb 12.8 oz (68.4 kg)  ?05/22/21 152 lb (68.9 kg)  ?06/02/20 156 lb (70.8 kg)  ?  ?Gen: no distress  ?Neck: No JVD, ?Cardiac: No Rubs or Gallops, no Murmur, RRR +2 radial pulses ?Respiratory: Clear to auscultation bilaterally, normal effort, normal  respiratory rate ?GI: Soft, nontender, non-distended  ?MS: No  edema;  moves all extremities ?Integument: Skin feels warm ?Neuro:  At time of evaluation, alert and oriented to person/place/time/situation  ?Psych: Normal affect, patient feels well ? ? ?ASSESSMENT:   ? ?No diagnosis found. ? ?PLAN:   ? ?HLD ?Family history of CAD ?Coronary Artery Calcium ?- Discussed recommendation of Mediterranean and Dash Diets; discussed the evidence behind vegan diets ?- Discussed recommendations for 150 minutes weekly exercise (outside of working at the plant) ?- Reviewed the limitations of  medical therapy for HDL targets ?- lipids today and ALT ?- though BMI is not a perfect measurement in all patient populations; a BMI < 30 can improve cardiac outcomes; present BMI is 25 and improved- keep up the great work ?- if LDL < 55, will cut rosuvastatin to 5 mg ; at 3 month f/u can get her Lp(a) ? ?APP Video Visit in one year ?Me PRN ? ? ?Medication Adjustments/Labs and Tests Ordered: ?Current medicines are reviewed at length with the patient today.  Concerns regarding medicines are outlined above.  ?No orders of the defined types were placed in this encounter. ? ?No orders of  the defined types were placed in this encounter. ? ? ?There are no Patient Instructions on file for this visit.  ? ?Signed, ?08/02/20, MD  ?06/01/2021 8:06 AM    ?[facility] ? ?

## 2021-06-01 ENCOUNTER — Ambulatory Visit: Payer: BC Managed Care – PPO | Admitting: Internal Medicine

## 2021-06-01 ENCOUNTER — Other Ambulatory Visit: Payer: BC Managed Care – PPO | Admitting: *Deleted

## 2021-06-01 ENCOUNTER — Encounter: Payer: Self-pay | Admitting: Internal Medicine

## 2021-06-01 ENCOUNTER — Other Ambulatory Visit: Payer: Self-pay

## 2021-06-01 VITALS — BP 130/88 | HR 65 | Ht 64.0 in | Wt 150.8 lb

## 2021-06-01 DIAGNOSIS — I2510 Atherosclerotic heart disease of native coronary artery without angina pectoris: Secondary | ICD-10-CM | POA: Diagnosis not present

## 2021-06-01 DIAGNOSIS — Z8249 Family history of ischemic heart disease and other diseases of the circulatory system: Secondary | ICD-10-CM | POA: Diagnosis not present

## 2021-06-01 DIAGNOSIS — I2584 Coronary atherosclerosis due to calcified coronary lesion: Secondary | ICD-10-CM | POA: Diagnosis not present

## 2021-06-01 DIAGNOSIS — E782 Mixed hyperlipidemia: Secondary | ICD-10-CM | POA: Diagnosis not present

## 2021-06-01 LAB — HEPATIC FUNCTION PANEL
ALT: 18 IU/L (ref 0–32)
AST: 17 IU/L (ref 0–40)
Albumin: 4.7 g/dL (ref 3.8–4.9)
Alkaline Phosphatase: 38 IU/L — ABNORMAL LOW (ref 44–121)
Bilirubin Total: 0.6 mg/dL (ref 0.0–1.2)
Bilirubin, Direct: 0.17 mg/dL (ref 0.00–0.40)
Total Protein: 6.7 g/dL (ref 6.0–8.5)

## 2021-06-01 LAB — LIPID PANEL
Chol/HDL Ratio: 2.7 ratio (ref 0.0–4.4)
Cholesterol, Total: 106 mg/dL (ref 100–199)
HDL: 40 mg/dL (ref >39)
LDL Chol Calc (NIH): 54 mg/dL (ref 0–99)
Triglycerides: 51 mg/dL (ref 0–149)
VLDL Cholesterol Cal: 12 mg/dL (ref 5–40)

## 2021-06-01 NOTE — Patient Instructions (Addendum)
Medication Instructions:  ?Your physician recommends that you continue on your current medications as directed. Please refer to the Current Medication list given to you today. ? ?*If you need a refill on your cardiac medications before your next appointment, please call your pharmacy* ? ? ?Lab Work: ?NONE ?If you have labs (blood work) drawn today and your tests are completely normal, you will receive your results only by: ?MyChart Message (if you have MyChart) OR ?A paper copy in the mail ?If you have any lab test that is abnormal or we need to change your treatment, we will call you to review the results. ? ? ?Testing/Procedures: ?NONE ? ? ?Follow-Up: ?At [facility], you and your health needs are our priority.  As part of our continuing mission to provide you with exceptional heart care, we have created designated Provider Care Teams.  These Care Teams include your primary Cardiologist (physician) and Advanced Practice Providers (APPs -  Physician Assistants and Nurse Practitioners) who all work together to provide you with the care you need, when you need it. ? ?We recommend signing up for the patient portal called "MyChart".  Sign up information is provided on this After Visit Summary.  MyChart is used to connect with patients for Virtual Visits (Telemedicine).  Patients are able to view lab/test results, encounter notes, upcoming appointments, etc.  Non-urgent messages can be sent to your provider as well.   ?To learn more about what you can do with MyChart, go to [email or web address].   ? ?Your next appointment:   ?1 year(s) ? ?The format for your next appointment:   ?Virtual Visit ? ?Provider:   ?Ronie Spies, PA-C or Jacolyn Reedy, PA-C      ? ?

## 2021-06-18 ENCOUNTER — Other Ambulatory Visit: Payer: Self-pay | Admitting: Internal Medicine

## 2021-06-18 DIAGNOSIS — E782 Mixed hyperlipidemia: Secondary | ICD-10-CM

## 2021-06-26 DIAGNOSIS — Z23 Encounter for immunization: Secondary | ICD-10-CM | POA: Diagnosis not present

## 2021-07-13 DIAGNOSIS — H401131 Primary open-angle glaucoma, bilateral, mild stage: Secondary | ICD-10-CM | POA: Diagnosis not present

## 2021-07-27 ENCOUNTER — Ambulatory Visit: Payer: BC Managed Care – PPO | Admitting: Obstetrics and Gynecology

## 2021-09-19 DIAGNOSIS — E782 Mixed hyperlipidemia: Secondary | ICD-10-CM | POA: Diagnosis not present

## 2021-09-24 DIAGNOSIS — H401131 Primary open-angle glaucoma, bilateral, mild stage: Secondary | ICD-10-CM | POA: Diagnosis not present

## 2021-09-24 DIAGNOSIS — H0288B Meibomian gland dysfunction left eye, upper and lower eyelids: Secondary | ICD-10-CM | POA: Diagnosis not present

## 2021-09-26 DIAGNOSIS — E782 Mixed hyperlipidemia: Secondary | ICD-10-CM | POA: Diagnosis not present

## 2022-01-02 ENCOUNTER — Other Ambulatory Visit: Payer: Self-pay | Admitting: Obstetrics and Gynecology

## 2022-01-02 DIAGNOSIS — I739 Peripheral vascular disease, unspecified: Secondary | ICD-10-CM

## 2022-01-14 DIAGNOSIS — H401131 Primary open-angle glaucoma, bilateral, mild stage: Secondary | ICD-10-CM | POA: Diagnosis not present

## 2022-01-28 ENCOUNTER — Ambulatory Visit
Admission: RE | Admit: 2022-01-28 | Discharge: 2022-01-28 | Disposition: A | Discharge status: Home / Self Care | Payer: BC Managed Care – PPO | Source: Ambulatory Visit | Attending: Obstetrics and Gynecology | Admitting: Obstetrics and Gynecology

## 2022-01-28 DIAGNOSIS — I739 Peripheral vascular disease, unspecified: Secondary | ICD-10-CM

## 2022-01-28 DIAGNOSIS — Z1231 Encounter for screening mammogram for malignant neoplasm of breast: Secondary | ICD-10-CM | POA: Diagnosis not present

## 2022-03-28 DIAGNOSIS — Z0000 Encounter for general adult medical examination without abnormal findings: Secondary | ICD-10-CM | POA: Diagnosis not present

## 2022-03-28 DIAGNOSIS — Z13220 Encounter for screening for lipoid disorders: Secondary | ICD-10-CM | POA: Diagnosis not present

## 2022-03-28 DIAGNOSIS — E785 Hyperlipidemia, unspecified: Secondary | ICD-10-CM | POA: Diagnosis not present

## 2022-03-28 DIAGNOSIS — Z114 Encounter for screening for human immunodeficiency virus [HIV]: Secondary | ICD-10-CM | POA: Diagnosis not present

## 2022-04-02 DIAGNOSIS — Z1159 Encounter for screening for other viral diseases: Secondary | ICD-10-CM | POA: Diagnosis not present

## 2022-04-02 DIAGNOSIS — Z23 Encounter for immunization: Secondary | ICD-10-CM | POA: Diagnosis not present

## 2022-04-02 DIAGNOSIS — Z0000 Encounter for general adult medical examination without abnormal findings: Secondary | ICD-10-CM | POA: Diagnosis not present

## 2022-04-18 DIAGNOSIS — H401131 Primary open-angle glaucoma, bilateral, mild stage: Secondary | ICD-10-CM | POA: Diagnosis not present

## 2022-04-18 DIAGNOSIS — H401121 Primary open-angle glaucoma, left eye, mild stage: Secondary | ICD-10-CM | POA: Diagnosis not present

## 2022-06-12 DIAGNOSIS — E785 Hyperlipidemia, unspecified: Secondary | ICD-10-CM | POA: Diagnosis not present

## 2022-06-12 DIAGNOSIS — E669 Obesity, unspecified: Secondary | ICD-10-CM | POA: Diagnosis not present

## 2022-06-12 DIAGNOSIS — R030 Elevated blood-pressure reading, without diagnosis of hypertension: Secondary | ICD-10-CM | POA: Diagnosis not present

## 2022-06-26 NOTE — Progress Notes (Signed)
52 y.o. [id number] Married White or Caucasian Not Hispanic or Latino female here for annual exam.  She has a mirena IUD, placed in 4/18. She has some cyclic spotting. No vasomotor symptoms. Sexually active, no pain.    She started on metformin for weight loss. Cardiac CT with a calcium score of 26. She was started on crestor.   No LMP recorded. (Menstrual status: IUD).          Sexually active: Yes.    The current method of family planning is IUD.    Exercising: Yes.     Walking  Smoker:  no  Health Maintenance: Pap:   12/20/19 ASCUS Hr HPV Neg, 08/11/18 WNL  History of abnormal Pap:  no MMG:  01/30/22 Bi-rads 1 neg  BMD:   none  Colonoscopy: 03/07/21 normal TDaP:  03/02/21  Gardasil: n/a   reports that she has never smoked. She has never used smokeless tobacco. She reports current alcohol use of about 1.0 standard drink of alcohol per week. She reports that she does not use drugs. She works as a Engineer, building services, but will be transitioning to another position in [employer] where she can work from home. Kids are 17, 20 and 28. Husband has 4 kids.  Past Medical History:  Diagnosis Date   Allergy    seasonal   Family history of early CAD     Past Surgical History:  Procedure Laterality Date   DILATION AND CURETTAGE OF UTERUS     1998 & 2001     Current Outpatient Medications  Medication Sig Dispense Refill   cetirizine (ZYRTEC) 10 MG tablet Take 10 mg by mouth daily.     cholecalciferol (VITAMIN D3) 25 MCG (1000 UNIT) tablet Take 1,000 Units by mouth daily.     latanoprost (XALATAN) 0.005 % ophthalmic solution      levonorgestrel (MIRENA) 20 MCG/24HR IUD 1 each by Intrauterine route once.     MOUNJARO 5 MG/0.5ML Pen SMARTSIG:5 Milligram(s) SUB-Q Once a Week     rosuvastatin (CRESTOR) 10 MG tablet Take 1 tablet (10 mg total) by mouth daily. 90 tablet 3   XIIDRA 5 % SOLN Apply 1 drop to eye 2 (two) times daily.     No current facility-administered medications for this visit.    Family  History  Problem Relation Age of Onset   Emphysema Mother    COPD Mother        Emphysema - died    Varicose Veins Mother    Coronary artery disease Father    Heart disease Father        Bypass, Mitral replacement, pacemaker    Hyperlipidemia Father    Hypertension Father    Cancer Father        Esophogeal   Coronary artery disease Maternal Grandfather    Heart disease Maternal Grandfather    Cervical cancer Paternal Grandmother    Cancer Paternal Grandmother    Heart disease Brother 73       CAD, 2 stents   Birth defects Paternal Grandfather     Review of Systems  All other systems reviewed and are negative.   Exam:   There were no vitals taken for this visit.  Weight change: @WEIGHTCHANGE @ Height:      Ht Readings from Last 3 Encounters:  06/01/21 5\' 4"  (1.626 m)  05/22/21 5\' 4"  (1.626 m)  06/02/20 5\' 4"  (1.626 m)    General appearance: alert, cooperative and appears stated age Head: Normocephalic, without obvious  abnormality, atraumatic Neck: no adenopathy, supple, symmetrical, trachea midline and thyroid normal to inspection and palpation Lungs: clear to auscultation bilaterally Cardiovascular: regular rate and rhythm Breasts: normal appearance, no masses or tenderness Abdomen: soft, non-tender; non distended,  no masses,  no organomegaly Extremities: extremities normal, atraumatic, no cyanosis or edema Skin: Skin color, texture, turgor normal. No rashes or lesions Lymph nodes: Cervical, supraclavicular, and axillary nodes normal. No abnormal inguinal nodes palpated Neurologic: Grossly normal   Pelvic: External genitalia:  no lesions              Urethra:  normal appearing urethra with no masses, tenderness or lesions              Bartholins and Skenes: normal                 Vagina: normal appearing vagina with normal color and discharge, no lesions              Cervix: no lesions               Bimanual Exam:  Uterus:  normal size, contour, position,  consistency, mobility, non-tender and retroverted              Adnexa: no mass, fullness, tenderness               Rectovaginal: Confirms               Anus:  normal sphincter tone, no lesions  Gae Dry, CMA chaperoned for the exam.  1. Well woman exam Discussed breast self exam Discussed calcium and vit D intake Mammogram and colonoscopy are UTD Labs with primary  2. Screening for cervical cancer - Cytology - PAP

## 2022-07-04 ENCOUNTER — Ambulatory Visit (INDEPENDENT_AMBULATORY_CARE_PROVIDER_SITE_OTHER): Payer: BC Managed Care – PPO | Admitting: Obstetrics and Gynecology

## 2022-07-04 ENCOUNTER — Other Ambulatory Visit (HOSPITAL_COMMUNITY)
Admission: RE | Admit: 2022-07-04 | Discharge: 2022-07-04 | Disposition: A | Discharge status: Home / Self Care | Payer: BC Managed Care – PPO | Source: Ambulatory Visit | Attending: Obstetrics and Gynecology | Admitting: Obstetrics and Gynecology

## 2022-07-04 ENCOUNTER — Encounter: Payer: Self-pay | Admitting: Obstetrics and Gynecology

## 2022-07-04 VITALS — BP 120/72 | HR 68 | Ht 64.0 in | Wt 154.0 lb

## 2022-07-04 DIAGNOSIS — Z01419 Encounter for gynecological examination (general) (routine) without abnormal findings: Secondary | ICD-10-CM | POA: Diagnosis not present

## 2022-07-04 DIAGNOSIS — Z124 Encounter for screening for malignant neoplasm of cervix: Secondary | ICD-10-CM

## 2022-07-04 NOTE — Patient Instructions (Signed)

## 2022-07-08 LAB — CYTOLOGY - PAP
Comment: NEGATIVE
Diagnosis: UNDETERMINED — AB
High risk HPV: NEGATIVE

## 2022-07-19 DIAGNOSIS — R030 Elevated blood-pressure reading, without diagnosis of hypertension: Secondary | ICD-10-CM | POA: Diagnosis not present

## 2022-07-19 DIAGNOSIS — I10 Essential (primary) hypertension: Secondary | ICD-10-CM | POA: Diagnosis not present

## 2022-07-19 DIAGNOSIS — F5081 Binge eating disorder: Secondary | ICD-10-CM | POA: Diagnosis not present

## 2022-07-19 DIAGNOSIS — E669 Obesity, unspecified: Secondary | ICD-10-CM | POA: Diagnosis not present

## 2022-08-08 DIAGNOSIS — N951 Menopausal and female climacteric states: Secondary | ICD-10-CM | POA: Diagnosis not present

## 2022-08-16 DIAGNOSIS — F5081 Binge eating disorder: Secondary | ICD-10-CM | POA: Diagnosis not present

## 2022-08-16 DIAGNOSIS — G4700 Insomnia, unspecified: Secondary | ICD-10-CM | POA: Diagnosis not present

## 2022-08-16 DIAGNOSIS — H401131 Primary open-angle glaucoma, bilateral, mild stage: Secondary | ICD-10-CM | POA: Diagnosis not present

## 2022-08-16 DIAGNOSIS — Z8639 Personal history of other endocrine, nutritional and metabolic disease: Secondary | ICD-10-CM | POA: Diagnosis not present

## 2022-10-07 DIAGNOSIS — G43909 Migraine, unspecified, not intractable, without status migrainosus: Secondary | ICD-10-CM | POA: Diagnosis not present

## 2022-10-07 DIAGNOSIS — F5081 Binge eating disorder: Secondary | ICD-10-CM | POA: Diagnosis not present

## 2022-10-07 DIAGNOSIS — R030 Elevated blood-pressure reading, without diagnosis of hypertension: Secondary | ICD-10-CM | POA: Diagnosis not present

## 2022-10-07 DIAGNOSIS — I10 Essential (primary) hypertension: Secondary | ICD-10-CM | POA: Diagnosis not present

## 2022-10-19 IMAGING — CT CT CARDIAC CORONARY ARTERY CALCIUM SCORE
3 series · 14 of 20 positions shown, 15 images · non-contrast
Comparison: None.
COMPARISON: None.

Addendum:
EXAM:
OVER-READ INTERPRETATION  CT CHEST

The following report is an over-read performed by radiologist Dr.
Blain Jumper [REDACTED] on 04/07/2020. This
over-read does not include interpretation of cardiac or coronary
anatomy or pathology. The coronary calcium score interpretation by
the cardiologist is attached.
CLINICAL DATA: Risk stratification: 49 Year-old White Female
Coronary Calcium Score
TECHNIQUE: The patient was scanned on a Siemens Force scanner. Axial
non-contrast 3 mm slices were carried out through the heart. The
data set was analyzed on a dedicated work station and scored using
the Agatson method.

[Series 2: casc 3.0 bv41 2 bestdiast 70 % · axial · 0.35mm/px · z∈[-211,-130]mm · 4 of 46 slices shown, 5 images]
[im 10/46  vessel]
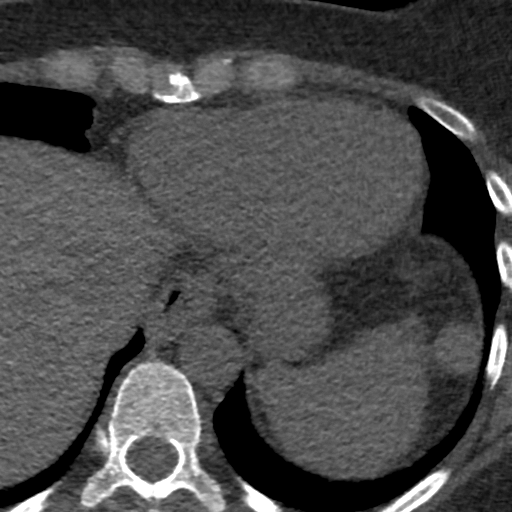
[im 10/46  lung]
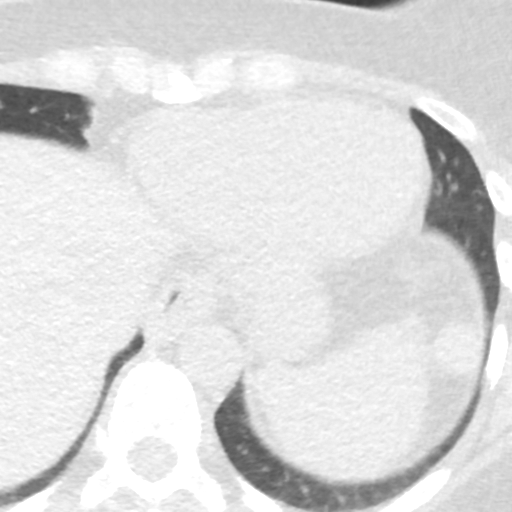
[im 19/46  vessel]
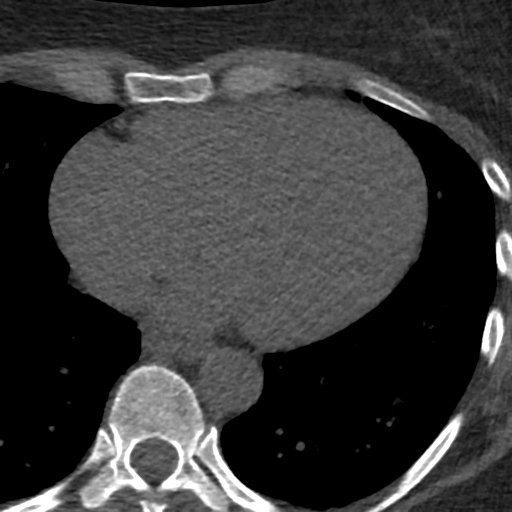
[im 28/46  vessel]
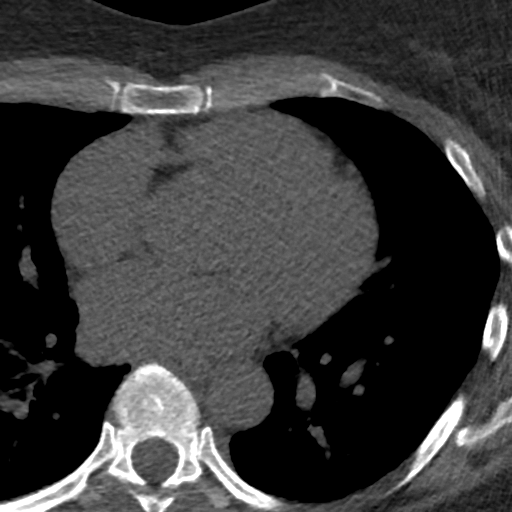
[im 37/46  vessel]
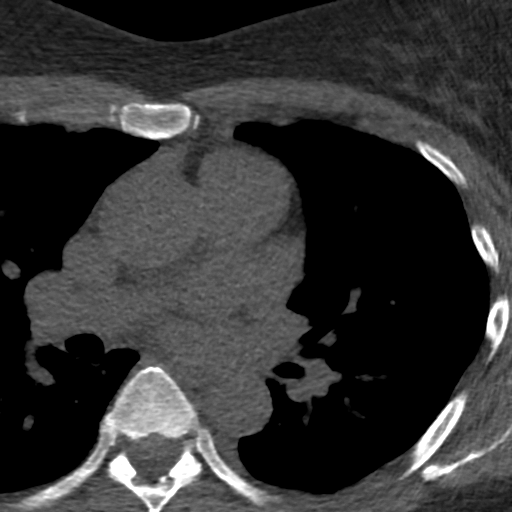

[Series 3: lung 70 % · axial · 0.68mm/px · z∈[-217,-127]mm · 5 of 46 slices shown]
[im 8/46  lung]
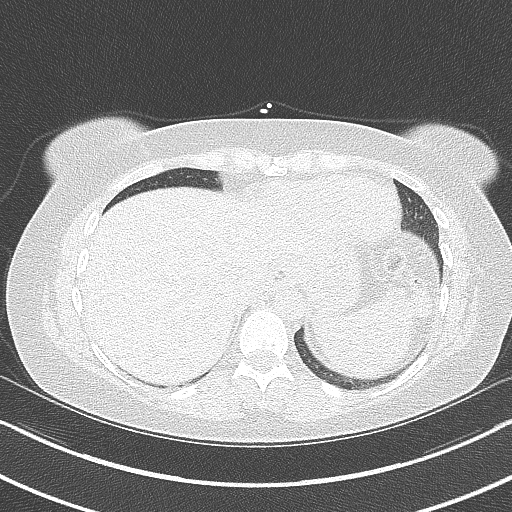
[im 16/46  lung]
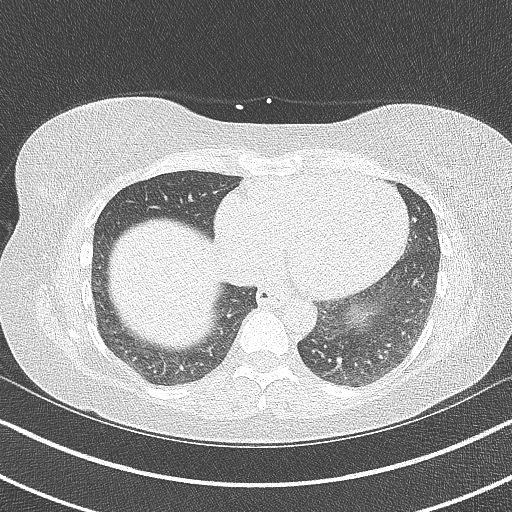
[im 23/46  lung]
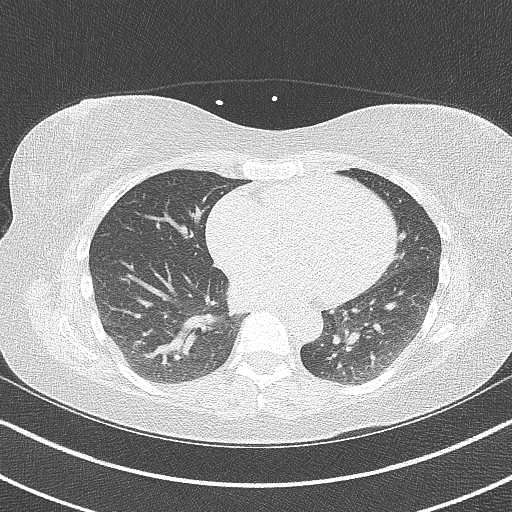
[im 31/46  lung]
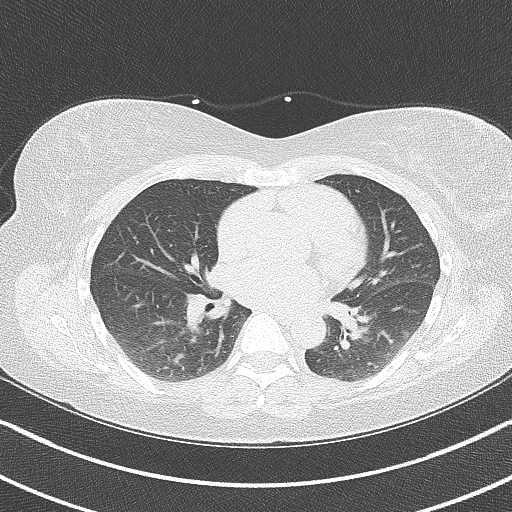
[im 38/46  lung]
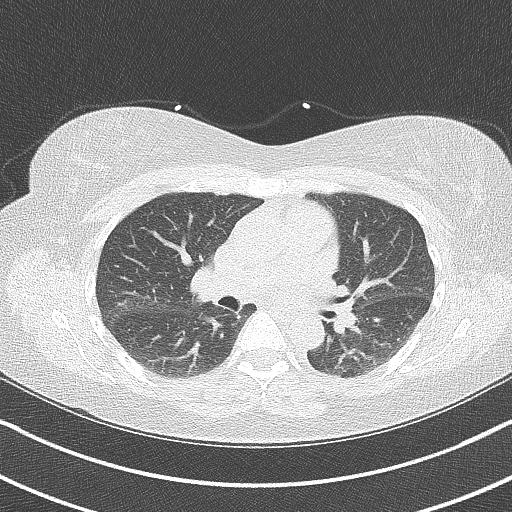

[Series 4: lung st 70 % · axial · 0.68mm/px · z∈[-217,-127]mm · 5 of 46 slices shown]
[im 8/46  lung]
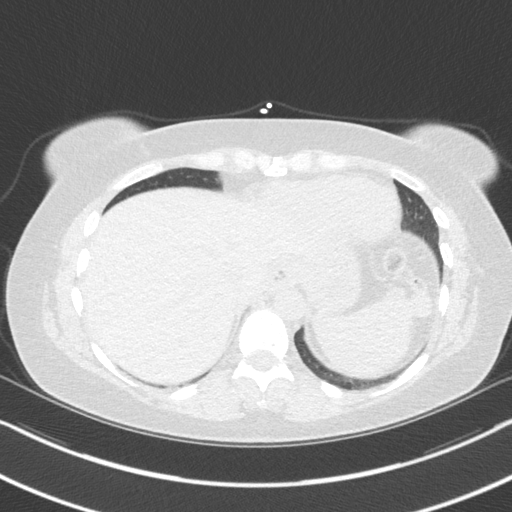
[im 16/46  lung]
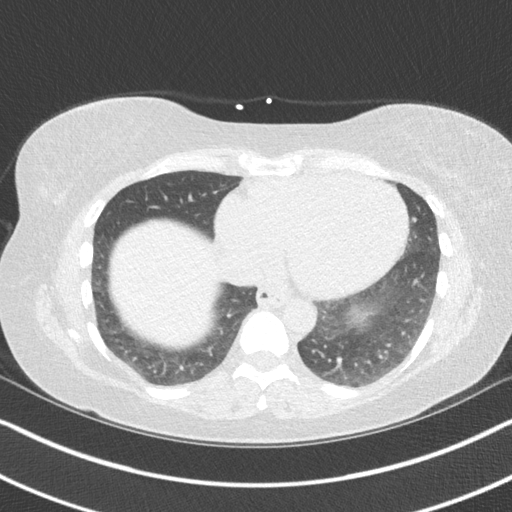
[im 23/46  lung]
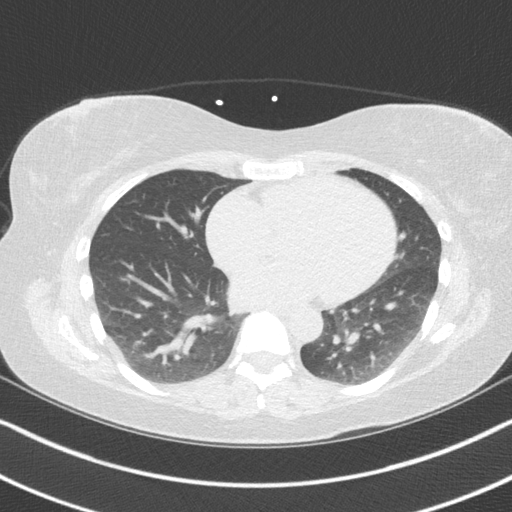
[im 31/46  lung]
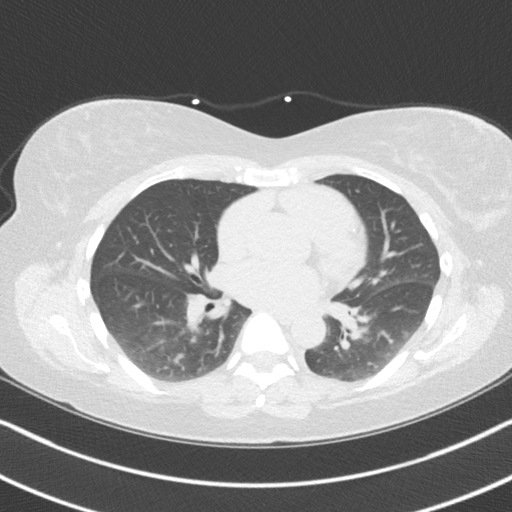
[im 38/46  lung]
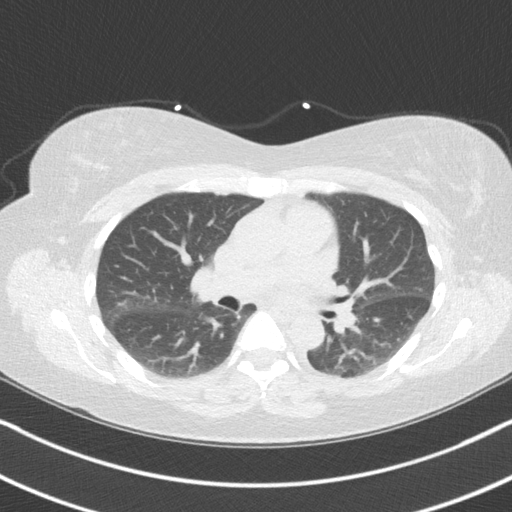

[14 of 20 positions shown; findings below may reference images not displayed]

FINDINGS: Within the visualized portions of the thorax there are no suspicious
appearing pulmonary nodules or masses, there is no acute
consolidative airspace disease, no pleural effusions, no
pneumothorax and no lymphadenopathy. Visualized portions of the
upper abdomen are unremarkable. There are no aggressive appearing
lytic or blastic lesions noted in the visualized portions of the
skeleton.
IMPRESSION: No significant incidental noncardiac findings are noted.
FINDINGS: Non-cardiac: See separate report from [REDACTED].

Ascending Aorta: Normal caliber.

Pericardium: Normal.

Coronary arteries: Normal origins.

Coronary calcium score of 26. This was 94th percentile for age,
gender, and race matched controls.
IMPRESSION: 1. Coronary calcium score of 26. This was 94th percentile for age,
gender, and race matched controls.

*** End of Addendum ***
EXAM:
OVER-READ INTERPRETATION  CT CHEST

The following report is an over-read performed by radiologist Dr.
Blain Jumper [REDACTED] on 04/07/2020. This
over-read does not include interpretation of cardiac or coronary
anatomy or pathology. The coronary calcium score interpretation by
the cardiologist is attached.
FINDINGS: Within the visualized portions of the thorax there are no suspicious
appearing pulmonary nodules or masses, there is no acute
consolidative airspace disease, no pleural effusions, no
pneumothorax and no lymphadenopathy. Visualized portions of the
upper abdomen are unremarkable. There are no aggressive appearing
lytic or blastic lesions noted in the visualized portions of the
skeleton.
IMPRESSION: No significant incidental noncardiac findings are noted.

## 2022-11-04 DIAGNOSIS — R030 Elevated blood-pressure reading, without diagnosis of hypertension: Secondary | ICD-10-CM | POA: Diagnosis not present

## 2022-11-04 DIAGNOSIS — R519 Headache, unspecified: Secondary | ICD-10-CM | POA: Diagnosis not present

## 2022-11-04 DIAGNOSIS — Z8639 Personal history of other endocrine, nutritional and metabolic disease: Secondary | ICD-10-CM | POA: Diagnosis not present

## 2022-11-18 DIAGNOSIS — H16223 Keratoconjunctivitis sicca, not specified as Sjogren's, bilateral: Secondary | ICD-10-CM | POA: Diagnosis not present

## 2022-11-18 DIAGNOSIS — H0288A Meibomian gland dysfunction right eye, upper and lower eyelids: Secondary | ICD-10-CM | POA: Diagnosis not present

## 2022-11-18 DIAGNOSIS — H401131 Primary open-angle glaucoma, bilateral, mild stage: Secondary | ICD-10-CM | POA: Diagnosis not present

## 2022-11-18 DIAGNOSIS — H0288B Meibomian gland dysfunction left eye, upper and lower eyelids: Secondary | ICD-10-CM | POA: Diagnosis not present

## 2023-01-13 ENCOUNTER — Other Ambulatory Visit: Payer: Self-pay | Admitting: Family Medicine

## 2023-01-13 DIAGNOSIS — Z1231 Encounter for screening mammogram for malignant neoplasm of breast: Secondary | ICD-10-CM

## 2023-02-07 DIAGNOSIS — H401131 Primary open-angle glaucoma, bilateral, mild stage: Secondary | ICD-10-CM | POA: Diagnosis not present

## 2023-02-12 ENCOUNTER — Ambulatory Visit: Payer: BC Managed Care – PPO

## 2023-02-18 ENCOUNTER — Ambulatory Visit
Admission: RE | Admit: 2023-02-18 | Discharge: 2023-02-18 | Disposition: A | Discharge status: Home / Self Care | Payer: BC Managed Care – PPO | Source: Ambulatory Visit | Attending: Family Medicine | Admitting: Family Medicine

## 2023-02-18 DIAGNOSIS — Z1231 Encounter for screening mammogram for malignant neoplasm of breast: Secondary | ICD-10-CM

## 2023-02-21 DIAGNOSIS — H401131 Primary open-angle glaucoma, bilateral, mild stage: Secondary | ICD-10-CM | POA: Diagnosis not present

## 2023-03-31 DIAGNOSIS — Z0000 Encounter for general adult medical examination without abnormal findings: Secondary | ICD-10-CM | POA: Diagnosis not present

## 2023-03-31 DIAGNOSIS — Z114 Encounter for screening for human immunodeficiency virus [HIV]: Secondary | ICD-10-CM | POA: Diagnosis not present

## 2023-03-31 DIAGNOSIS — Z13220 Encounter for screening for lipoid disorders: Secondary | ICD-10-CM | POA: Diagnosis not present

## 2023-04-07 DIAGNOSIS — Z0000 Encounter for general adult medical examination without abnormal findings: Secondary | ICD-10-CM | POA: Diagnosis not present

## 2023-04-07 DIAGNOSIS — H6122 Impacted cerumen, left ear: Secondary | ICD-10-CM | POA: Diagnosis not present

## 2023-04-07 DIAGNOSIS — K5900 Constipation, unspecified: Secondary | ICD-10-CM | POA: Diagnosis not present

## 2023-05-02 DIAGNOSIS — R5383 Other fatigue: Secondary | ICD-10-CM | POA: Diagnosis not present

## 2023-05-02 DIAGNOSIS — E559 Vitamin D deficiency, unspecified: Secondary | ICD-10-CM | POA: Diagnosis not present

## 2023-05-06 DIAGNOSIS — R635 Abnormal weight gain: Secondary | ICD-10-CM | POA: Diagnosis not present

## 2023-05-06 DIAGNOSIS — K5900 Constipation, unspecified: Secondary | ICD-10-CM | POA: Diagnosis not present

## 2023-05-06 DIAGNOSIS — E559 Vitamin D deficiency, unspecified: Secondary | ICD-10-CM | POA: Diagnosis not present

## 2023-05-06 DIAGNOSIS — Z30432 Encounter for removal of intrauterine contraceptive device: Secondary | ICD-10-CM | POA: Diagnosis not present

## 2023-06-13 DIAGNOSIS — H401131 Primary open-angle glaucoma, bilateral, mild stage: Secondary | ICD-10-CM | POA: Diagnosis not present

## 2023-07-02 DIAGNOSIS — R5383 Other fatigue: Secondary | ICD-10-CM | POA: Diagnosis not present

## 2023-07-07 ENCOUNTER — Encounter: Payer: Self-pay | Admitting: Obstetrics and Gynecology

## 2023-07-07 ENCOUNTER — Ambulatory Visit (INDEPENDENT_AMBULATORY_CARE_PROVIDER_SITE_OTHER): Payer: BC Managed Care – PPO | Admitting: Obstetrics and Gynecology

## 2023-07-07 ENCOUNTER — Other Ambulatory Visit (HOSPITAL_COMMUNITY)
Admission: RE | Admit: 2023-07-07 | Discharge: 2023-07-07 | Disposition: A | Discharge status: Home / Self Care | Source: Ambulatory Visit | Attending: Obstetrics and Gynecology | Admitting: Obstetrics and Gynecology

## 2023-07-07 VITALS — BP 118/78 | HR 79 | Ht 64.0 in | Wt 157.0 lb

## 2023-07-07 DIAGNOSIS — I10 Essential (primary) hypertension: Secondary | ICD-10-CM | POA: Diagnosis not present

## 2023-07-07 DIAGNOSIS — Z01419 Encounter for gynecological examination (general) (routine) without abnormal findings: Secondary | ICD-10-CM | POA: Diagnosis not present

## 2023-07-07 DIAGNOSIS — G4700 Insomnia, unspecified: Secondary | ICD-10-CM | POA: Diagnosis not present

## 2023-07-07 DIAGNOSIS — Z1331 Encounter for screening for depression: Secondary | ICD-10-CM | POA: Diagnosis not present

## 2023-07-07 DIAGNOSIS — N952 Postmenopausal atrophic vaginitis: Secondary | ICD-10-CM | POA: Diagnosis not present

## 2023-07-07 DIAGNOSIS — Z1231 Encounter for screening mammogram for malignant neoplasm of breast: Secondary | ICD-10-CM

## 2023-07-07 DIAGNOSIS — E2839 Other primary ovarian failure: Secondary | ICD-10-CM

## 2023-07-07 DIAGNOSIS — N951 Menopausal and female climacteric states: Secondary | ICD-10-CM | POA: Diagnosis not present

## 2023-07-07 DIAGNOSIS — K5900 Constipation, unspecified: Secondary | ICD-10-CM | POA: Diagnosis not present

## 2023-07-07 NOTE — Progress Notes (Signed)
 53 y.o. y.o. established female here for annual exam. Patient's last menstrual period was 06/13/2023 (approximate). Period Duration (Days): 3-4 Period Pattern: Regular Menstrual Flow: Light Menstrual Control: Thin pad Dysmenorrhea: None   [id number] Married White or Caucasian Not Hispanic or Latino female here for annual exam.  She had a mirena IUD, placed in 4/18 that was removed in February, 2025.  States light bleeding in February and March. Did HRT labs with PMD recently and reports she is in menopause range. Sexually active, no pain.  She started on metformin for weight loss. Cardiac CT with a calcium score of 26. She was started on crestor.   No LMP recorded. (Menstrual status: IUD).          Sexually active: Yes.    The current method of family planning is IUD.    Exercising: Yes.    Walking  Smoker:  no  Health Maintenance: Pap:   12/20/22 ASCUS Hr HPV Neg, 08/11/18 WNL  History of abnormal Pap:  no MMG:  01/31/23 Bi-rads 1 neg  BMD:   baseline ordered Colonoscopy: 03/07/21 normal TDaP:  03/02/21  Gardasil: n/a Body mass index is 26.95 kg/m.     07/07/2023    3:01 PM 03/14/2020    9:29 AM 08/11/2018   10:43 AM  Depression screen PHQ 2/9  Decreased Interest 2 0 0  Down, Depressed, Hopeless 2 0 0  PHQ - 2 Score 4 0 0    Blood pressure 118/78, pulse 79, height 5\' 4"  (1.626 m), weight 157 lb (71.2 kg), last menstrual period 06/13/2023, SpO2 99%.     Component Value Date/Time   DIAGPAP (A) 07/04/2022 0825    - Atypical squamous cells of undetermined significance (ASC-US)   DIAGPAP  07/24/2017 0000    NEGATIVE FOR INTRAEPITHELIAL LESIONS OR MALIGNANCY.   HPVHIGH Negative 07/04/2022 0825   ADEQPAP  07/04/2022 0825    Satisfactory for evaluation; transformation zone component PRESENT.   ADEQPAP  07/24/2017 0000    Satisfactory for evaluation  endocervical/transformation zone component PRESENT.    GYN HISTORY:    Component Value Date/Time   DIAGPAP (A) 07/04/2022  0825    - Atypical squamous cells of undetermined significance (ASC-US)   DIAGPAP  07/24/2017 0000    NEGATIVE FOR INTRAEPITHELIAL LESIONS OR MALIGNANCY.   HPVHIGH Negative 07/04/2022 0825   ADEQPAP  07/04/2022 0825    Satisfactory for evaluation; transformation zone component PRESENT.   ADEQPAP  07/24/2017 0000    Satisfactory for evaluation  endocervical/transformation zone component PRESENT.    OB History  Gravida Para Term Preterm AB Living  5 3 3  2 3   SAB IAB Ectopic Multiple Live Births  2    3    # Outcome Date GA Lbr Len/2nd Weight Sex Type Anes PTL Lv  5 Term      Vag-Spont   LIV  4 SAB           3 Term      Vag-Spont   LIV  2 SAB           1 Term      Vag-Spont   LIV    Past Medical History:  Diagnosis Date   Allergy    seasonal   Family history of early CAD    Hypertension     Past Surgical History:  Procedure Laterality Date   DILATION AND CURETTAGE OF UTERUS     1998 & 2001     Current Outpatient Medications  on File Prior to Visit  Medication Sig Dispense Refill   cholecalciferol (VITAMIN D3) 25 MCG (1000 UNIT) tablet Take 1,000 Units by mouth daily.     latanoprost (XALATAN) 0.005 % ophthalmic solution      LINZESS 72 MCG capsule Take 72 mcg by mouth every morning.     MAGNESIUM PO Take by mouth.     MELATONIN PO Take by mouth.     metFORMIN (GLUCOPHAGE-XR) 500 MG 24 hr tablet Take 500 mg by mouth daily with breakfast.     Omega-3 Fatty Acids (FISH OIL) 300 MG CAPS Take by mouth.     rizatriptan (MAXALT-MLT) 10 MG disintegrating tablet Take by mouth as needed.     rosuvastatin (CRESTOR) 10 MG tablet Take 1 tablet (10 mg total) by mouth daily. 90 tablet 3   cetirizine (ZYRTEC) 10 MG tablet Take 10 mg by mouth daily. (Patient not taking: Reported on 07/07/2023)     levonorgestrel (MIRENA) 20 MCG/24HR IUD 1 each by Intrauterine route once. (Patient not taking: Reported on 07/07/2023)     No current facility-administered medications on file prior to visit.     Social History   Socioeconomic History   Marital status: Married    Spouse name: Not on file   Number of children: 3   Years of education: Not on file   Highest education level: Not on file  Occupational History   Occupation: Solicitor  Tobacco Use   Smoking status: Never   Smokeless tobacco: Never  Vaping Use   Vaping status: Never Used  Substance and Sexual Activity   Alcohol use: Yes    Alcohol/week: 1.0 standard drink of alcohol    Types: 1 Standard drinks or equivalent per week   Drug use: Never   Sexual activity: Yes    Partners: Male    Birth control/protection: Condom    Comment: inserted 07-24-16 Mirena   Other Topics Concern   Not on file  Social History Narrative   Not on file   Social Drivers of Health   Financial Resource Strain: Low Risk  (08/11/2018)   Overall Financial Resource Strain (CARDIA)    Difficulty of Paying Living Expenses: Not very hard  Food Insecurity: No Food Insecurity (08/11/2018)   Hunger Vital Sign    Worried About Running Out of Food in the Last Year: Never true    Ran Out of Food in the Last Year: Never true  Transportation Needs: No Transportation Needs (08/11/2018)   PRAPARE - Administrator, Civil Service (Medical): No    Lack of Transportation (Non-Medical): No  Physical Activity: Insufficiently Active (08/11/2018)   Exercise Vital Sign    Days of Exercise per Week: 3 days    Minutes of Exercise per Session: 30 min  Stress: Stress Concern Present (08/11/2018)   [employer] of Occupational Health - Occupational Stress Questionnaire    Feeling of Stress : To some extent  Social Connections: Not on file  Intimate Partner Violence: Not At Risk (08/11/2018)   Humiliation, Afraid, Rape, and Kick questionnaire    Fear of Current or Ex-Partner: No    Emotionally Abused: No    Physically Abused: No    Sexually Abused: No    Family History  Problem Relation Age of Onset   Emphysema Mother    COPD Mother         Emphysema - died    Varicose Veins Mother    Coronary artery disease Father  Heart disease Father        Bypass, Mitral replacement, pacemaker    Hyperlipidemia Father    Hypertension Father    Cancer Father        Esophogeal   Coronary artery disease Maternal Grandfather    Heart disease Maternal Grandfather    Cervical cancer Paternal Grandmother    Cancer Paternal Grandmother    Heart disease Brother 30       CAD, 2 stents   Birth defects Paternal Grandfather      Allergies  Allergen Reactions   Amoxicillin Hives      Patient's last menstrual period was Patient's last menstrual period was 06/13/2023 (approximate)..             Review of Systems Alls systems reviewed and are negative.     Physical Exam Constitutional:      Appearance: Normal appearance.  Genitourinary:     Vulva and urethral meatus normal.     No lesions in the vagina.     Right Labia: No rash, lesions or skin changes.    Left Labia: No lesions, skin changes or rash.    No vaginal discharge or tenderness.     No vaginal prolapse present.    Mild vaginal atrophy present.     Right Adnexa: not tender, not palpable and no mass present.    Left Adnexa: not tender, not palpable and no mass present.    No cervical motion tenderness or discharge.     Uterus is not enlarged, tender or irregular.  Breasts:    Right: Normal.     Left: Normal.  HENT:     Head: Normocephalic.  Neck:     Thyroid: No thyroid mass, thyromegaly or thyroid tenderness.  Cardiovascular:     Rate and Rhythm: Normal rate and regular rhythm.     Heart sounds: Normal heart sounds, S1 normal and S2 normal.  Pulmonary:     Effort: Pulmonary effort is normal.     Breath sounds: Normal breath sounds and air entry.  Abdominal:     General: There is no distension.     Palpations: Abdomen is soft. There is no mass.     Tenderness: There is no abdominal tenderness. There is no guarding or rebound.  Musculoskeletal:         General: Normal range of motion.     Cervical back: Full passive range of motion without pain, normal range of motion and neck supple. No tenderness.     Right lower leg: No edema.     Left lower leg: No edema.  Neurological:     Mental Status: She is alert.  Skin:    General: Skin is warm.  Psychiatric:        Mood and Affect: Mood normal.        Behavior: Behavior normal.        Thought Content: Thought content normal.  Vitals and nursing note reviewed. Exam conducted with a chaperone present.       A:         Well Woman GYN exam                             P:        Pap smear collected today Encouraged annual mammogram screening Colon cancer screening up-to-date DXA ordered today Labs and immunizations to do with PMD Discussed breast self exams Encouraged healthy lifestyle practices Encouraged Vit  D and Calcium   No follow-ups on file.  Earley Favor

## 2023-07-11 LAB — CYTOLOGY - PAP: Diagnosis: NEGATIVE

## 2023-07-14 ENCOUNTER — Encounter: Payer: Self-pay | Admitting: Obstetrics and Gynecology

## 2023-07-14 DIAGNOSIS — M722 Plantar fascial fibromatosis: Secondary | ICD-10-CM | POA: Diagnosis not present

## 2023-07-14 DIAGNOSIS — M170 Bilateral primary osteoarthritis of knee: Secondary | ICD-10-CM | POA: Diagnosis not present

## 2023-07-14 DIAGNOSIS — M545 Low back pain, unspecified: Secondary | ICD-10-CM | POA: Diagnosis not present

## 2023-07-14 DIAGNOSIS — B351 Tinea unguium: Secondary | ICD-10-CM | POA: Diagnosis not present

## 2023-07-17 ENCOUNTER — Other Ambulatory Visit (HOSPITAL_BASED_OUTPATIENT_CLINIC_OR_DEPARTMENT_OTHER)

## 2023-07-18 DIAGNOSIS — M1711 Unilateral primary osteoarthritis, right knee: Secondary | ICD-10-CM | POA: Diagnosis not present

## 2023-07-18 DIAGNOSIS — M1712 Unilateral primary osteoarthritis, left knee: Secondary | ICD-10-CM | POA: Diagnosis not present

## 2023-07-31 ENCOUNTER — Ambulatory Visit (HOSPITAL_BASED_OUTPATIENT_CLINIC_OR_DEPARTMENT_OTHER)
Admission: RE | Admit: 2023-07-31 | Discharge: 2023-07-31 | Disposition: A | Discharge status: Home / Self Care | Source: Ambulatory Visit | Attending: Obstetrics and Gynecology | Admitting: Obstetrics and Gynecology

## 2023-07-31 ENCOUNTER — Encounter: Payer: Self-pay | Admitting: Obstetrics and Gynecology

## 2023-07-31 DIAGNOSIS — Z780 Asymptomatic menopausal state: Secondary | ICD-10-CM | POA: Diagnosis not present

## 2023-07-31 DIAGNOSIS — E2839 Other primary ovarian failure: Secondary | ICD-10-CM | POA: Insufficient documentation

## 2023-07-31 DIAGNOSIS — Z01419 Encounter for gynecological examination (general) (routine) without abnormal findings: Secondary | ICD-10-CM | POA: Insufficient documentation

## 2023-08-06 DIAGNOSIS — I10 Essential (primary) hypertension: Secondary | ICD-10-CM | POA: Diagnosis not present

## 2023-08-06 DIAGNOSIS — F411 Generalized anxiety disorder: Secondary | ICD-10-CM | POA: Diagnosis not present

## 2023-08-06 DIAGNOSIS — G4700 Insomnia, unspecified: Secondary | ICD-10-CM | POA: Diagnosis not present

## 2023-08-06 DIAGNOSIS — F331 Major depressive disorder, recurrent, moderate: Secondary | ICD-10-CM | POA: Diagnosis not present

## 2023-08-20 DIAGNOSIS — H401131 Primary open-angle glaucoma, bilateral, mild stage: Secondary | ICD-10-CM | POA: Diagnosis not present

## 2023-09-16 DIAGNOSIS — F411 Generalized anxiety disorder: Secondary | ICD-10-CM | POA: Diagnosis not present

## 2023-09-16 DIAGNOSIS — G4700 Insomnia, unspecified: Secondary | ICD-10-CM | POA: Diagnosis not present

## 2023-09-16 DIAGNOSIS — K5900 Constipation, unspecified: Secondary | ICD-10-CM | POA: Diagnosis not present

## 2023-09-16 DIAGNOSIS — F331 Major depressive disorder, recurrent, moderate: Secondary | ICD-10-CM | POA: Diagnosis not present

## 2023-09-23 DIAGNOSIS — H401132 Primary open-angle glaucoma, bilateral, moderate stage: Secondary | ICD-10-CM | POA: Diagnosis not present

## 2023-10-21 DIAGNOSIS — E559 Vitamin D deficiency, unspecified: Secondary | ICD-10-CM | POA: Diagnosis not present

## 2023-10-21 DIAGNOSIS — R5383 Other fatigue: Secondary | ICD-10-CM | POA: Diagnosis not present

## 2023-10-23 DIAGNOSIS — M722 Plantar fascial fibromatosis: Secondary | ICD-10-CM | POA: Diagnosis not present

## 2023-10-23 DIAGNOSIS — M170 Bilateral primary osteoarthritis of knee: Secondary | ICD-10-CM | POA: Diagnosis not present

## 2023-10-23 DIAGNOSIS — K5900 Constipation, unspecified: Secondary | ICD-10-CM | POA: Diagnosis not present

## 2023-10-23 DIAGNOSIS — E559 Vitamin D deficiency, unspecified: Secondary | ICD-10-CM | POA: Diagnosis not present

## 2023-11-04 DIAGNOSIS — H0288A Meibomian gland dysfunction right eye, upper and lower eyelids: Secondary | ICD-10-CM | POA: Diagnosis not present

## 2023-11-04 DIAGNOSIS — H0288B Meibomian gland dysfunction left eye, upper and lower eyelids: Secondary | ICD-10-CM | POA: Diagnosis not present

## 2023-11-04 DIAGNOSIS — H16223 Keratoconjunctivitis sicca, not specified as Sjogren's, bilateral: Secondary | ICD-10-CM | POA: Diagnosis not present

## 2023-11-04 DIAGNOSIS — H401132 Primary open-angle glaucoma, bilateral, moderate stage: Secondary | ICD-10-CM | POA: Diagnosis not present

## 2023-12-17 DIAGNOSIS — E782 Mixed hyperlipidemia: Secondary | ICD-10-CM | POA: Diagnosis not present

## 2023-12-17 DIAGNOSIS — Z789 Other specified health status: Secondary | ICD-10-CM | POA: Diagnosis not present

## 2023-12-17 DIAGNOSIS — I10 Essential (primary) hypertension: Secondary | ICD-10-CM | POA: Diagnosis not present

## 2023-12-17 DIAGNOSIS — G4700 Insomnia, unspecified: Secondary | ICD-10-CM | POA: Diagnosis not present

## 2023-12-17 DIAGNOSIS — Z7185 Encounter for immunization safety counseling: Secondary | ICD-10-CM | POA: Diagnosis not present

## 2023-12-17 DIAGNOSIS — Z23 Encounter for immunization: Secondary | ICD-10-CM | POA: Diagnosis not present

## 2023-12-25 DIAGNOSIS — H401131 Primary open-angle glaucoma, bilateral, mild stage: Secondary | ICD-10-CM | POA: Diagnosis not present

## 2024-01-29 DIAGNOSIS — M9902 Segmental and somatic dysfunction of thoracic region: Secondary | ICD-10-CM | POA: Diagnosis not present

## 2024-01-29 DIAGNOSIS — M9901 Segmental and somatic dysfunction of cervical region: Secondary | ICD-10-CM | POA: Diagnosis not present

## 2024-01-29 DIAGNOSIS — M5032 Other cervical disc degeneration, mid-cervical region, unspecified level: Secondary | ICD-10-CM | POA: Diagnosis not present

## 2024-01-30 DIAGNOSIS — M5032 Other cervical disc degeneration, mid-cervical region, unspecified level: Secondary | ICD-10-CM | POA: Diagnosis not present

## 2024-01-30 DIAGNOSIS — M9901 Segmental and somatic dysfunction of cervical region: Secondary | ICD-10-CM | POA: Diagnosis not present

## 2024-01-30 DIAGNOSIS — M5134 Other intervertebral disc degeneration, thoracic region: Secondary | ICD-10-CM | POA: Diagnosis not present

## 2024-01-30 DIAGNOSIS — M9902 Segmental and somatic dysfunction of thoracic region: Secondary | ICD-10-CM | POA: Diagnosis not present

## 2024-02-02 DIAGNOSIS — M9901 Segmental and somatic dysfunction of cervical region: Secondary | ICD-10-CM | POA: Diagnosis not present

## 2024-02-02 DIAGNOSIS — M5032 Other cervical disc degeneration, mid-cervical region, unspecified level: Secondary | ICD-10-CM | POA: Diagnosis not present

## 2024-02-04 DIAGNOSIS — M9901 Segmental and somatic dysfunction of cervical region: Secondary | ICD-10-CM | POA: Diagnosis not present

## 2024-02-04 DIAGNOSIS — M5134 Other intervertebral disc degeneration, thoracic region: Secondary | ICD-10-CM | POA: Diagnosis not present

## 2024-02-04 DIAGNOSIS — M5032 Other cervical disc degeneration, mid-cervical region, unspecified level: Secondary | ICD-10-CM | POA: Diagnosis not present

## 2024-02-04 DIAGNOSIS — M9902 Segmental and somatic dysfunction of thoracic region: Secondary | ICD-10-CM | POA: Diagnosis not present

## 2024-02-09 DIAGNOSIS — M9901 Segmental and somatic dysfunction of cervical region: Secondary | ICD-10-CM | POA: Diagnosis not present

## 2024-02-09 DIAGNOSIS — M5032 Other cervical disc degeneration, mid-cervical region, unspecified level: Secondary | ICD-10-CM | POA: Diagnosis not present

## 2024-02-13 DIAGNOSIS — M9901 Segmental and somatic dysfunction of cervical region: Secondary | ICD-10-CM | POA: Diagnosis not present

## 2024-02-13 DIAGNOSIS — M5032 Other cervical disc degeneration, mid-cervical region, unspecified level: Secondary | ICD-10-CM | POA: Diagnosis not present

## 2024-02-18 DIAGNOSIS — M5032 Other cervical disc degeneration, mid-cervical region, unspecified level: Secondary | ICD-10-CM | POA: Diagnosis not present

## 2024-02-18 DIAGNOSIS — M9901 Segmental and somatic dysfunction of cervical region: Secondary | ICD-10-CM | POA: Diagnosis not present

## 2024-02-19 DIAGNOSIS — F411 Generalized anxiety disorder: Secondary | ICD-10-CM | POA: Diagnosis not present

## 2024-02-19 DIAGNOSIS — M542 Cervicalgia: Secondary | ICD-10-CM | POA: Diagnosis not present

## 2024-02-19 DIAGNOSIS — G4700 Insomnia, unspecified: Secondary | ICD-10-CM | POA: Diagnosis not present

## 2024-02-19 DIAGNOSIS — F331 Major depressive disorder, recurrent, moderate: Secondary | ICD-10-CM | POA: Diagnosis not present

## 2024-02-24 ENCOUNTER — Ambulatory Visit
Admission: RE | Admit: 2024-02-24 | Discharge: 2024-02-24 | Disposition: A | Source: Ambulatory Visit | Attending: Obstetrics and Gynecology

## 2024-02-24 DIAGNOSIS — Z1231 Encounter for screening mammogram for malignant neoplasm of breast: Secondary | ICD-10-CM | POA: Diagnosis not present

## 2024-02-24 DIAGNOSIS — M9901 Segmental and somatic dysfunction of cervical region: Secondary | ICD-10-CM | POA: Diagnosis not present

## 2024-02-24 DIAGNOSIS — M5032 Other cervical disc degeneration, mid-cervical region, unspecified level: Secondary | ICD-10-CM | POA: Diagnosis not present

## 2024-02-24 DIAGNOSIS — Z01419 Encounter for gynecological examination (general) (routine) without abnormal findings: Secondary | ICD-10-CM

## 2024-03-02 ENCOUNTER — Ambulatory Visit: Payer: Self-pay | Admitting: Obstetrics and Gynecology

## 2024-03-29 DIAGNOSIS — G4700 Insomnia, unspecified: Secondary | ICD-10-CM | POA: Diagnosis not present

## 2024-03-29 DIAGNOSIS — F331 Major depressive disorder, recurrent, moderate: Secondary | ICD-10-CM | POA: Diagnosis not present

## 2024-03-29 DIAGNOSIS — I10 Essential (primary) hypertension: Secondary | ICD-10-CM | POA: Diagnosis not present

## 2024-03-29 DIAGNOSIS — F411 Generalized anxiety disorder: Secondary | ICD-10-CM | POA: Diagnosis not present

## 2024-03-30 DIAGNOSIS — H401132 Primary open-angle glaucoma, bilateral, moderate stage: Secondary | ICD-10-CM | POA: Diagnosis not present
